# Patient Record
Sex: Female | Born: 1980 | Race: White | Hispanic: Yes | Marital: Married | Smoking: Never smoker
Health system: Southern US, Community
[De-identification: ages and names within clinical notes are randomized; demographics above are authoritative.]

## PROBLEM LIST (undated history)

## (undated) ENCOUNTER — Inpatient Hospital Stay (HOSPITAL_COMMUNITY): Payer: Self-pay

## (undated) DIAGNOSIS — E785 Hyperlipidemia, unspecified: Secondary | ICD-10-CM

## (undated) DIAGNOSIS — R51 Headache: Secondary | ICD-10-CM

## (undated) DIAGNOSIS — K2970 Gastritis, unspecified, without bleeding: Secondary | ICD-10-CM

## (undated) HISTORY — DX: Gastritis, unspecified, without bleeding: K29.70

## (undated) HISTORY — PX: DILATION AND CURETTAGE OF UTERUS: SHX78

---

## 2002-07-03 ENCOUNTER — Encounter (INDEPENDENT_AMBULATORY_CARE_PROVIDER_SITE_OTHER): Payer: Self-pay | Admitting: Specialist

## 2002-07-03 ENCOUNTER — Ambulatory Visit (HOSPITAL_COMMUNITY): Admission: AD | Admit: 2002-07-03 | Discharge: 2002-07-03 | Payer: Self-pay | Admitting: *Deleted

## 2003-07-11 ENCOUNTER — Other Ambulatory Visit: Admission: RE | Admit: 2003-07-11 | Discharge: 2003-07-11 | Payer: Self-pay | Admitting: *Deleted

## 2004-01-05 ENCOUNTER — Encounter: Admission: RE | Admit: 2004-01-05 | Discharge: 2004-01-05 | Payer: Self-pay | Admitting: *Deleted

## 2004-01-08 ENCOUNTER — Inpatient Hospital Stay (HOSPITAL_COMMUNITY): Admission: AD | Admit: 2004-01-08 | Discharge: 2004-01-09 | Payer: Self-pay | Admitting: *Deleted

## 2005-06-03 ENCOUNTER — Ambulatory Visit: Payer: Self-pay | Admitting: Nurse Practitioner

## 2005-07-03 ENCOUNTER — Other Ambulatory Visit: Admission: RE | Admit: 2005-07-03 | Discharge: 2005-07-03 | Payer: Self-pay | Admitting: Nurse Practitioner

## 2005-07-03 ENCOUNTER — Ambulatory Visit: Payer: Self-pay | Admitting: Nurse Practitioner

## 2005-07-03 ENCOUNTER — Encounter (INDEPENDENT_AMBULATORY_CARE_PROVIDER_SITE_OTHER): Payer: Self-pay | Admitting: *Deleted

## 2006-01-29 ENCOUNTER — Ambulatory Visit (HOSPITAL_COMMUNITY): Admission: RE | Admit: 2006-01-29 | Discharge: 2006-01-29 | Payer: Self-pay | Admitting: *Deleted

## 2006-04-01 ENCOUNTER — Inpatient Hospital Stay (HOSPITAL_COMMUNITY): Admission: AD | Admit: 2006-04-01 | Discharge: 2006-04-01 | Payer: Self-pay | Admitting: Obstetrics & Gynecology

## 2006-04-01 ENCOUNTER — Ambulatory Visit: Payer: Self-pay | Admitting: *Deleted

## 2006-04-10 ENCOUNTER — Ambulatory Visit: Payer: Self-pay | Admitting: Obstetrics & Gynecology

## 2006-04-12 ENCOUNTER — Inpatient Hospital Stay (HOSPITAL_COMMUNITY): Admission: AD | Admit: 2006-04-12 | Discharge: 2006-04-14 | Payer: Self-pay | Admitting: Gynecology

## 2006-04-12 ENCOUNTER — Ambulatory Visit: Payer: Self-pay | Admitting: Obstetrics and Gynecology

## 2010-03-06 ENCOUNTER — Emergency Department (HOSPITAL_COMMUNITY): Admission: EM | Admit: 2010-03-06 | Discharge: 2010-03-06 | Payer: Self-pay | Admitting: Emergency Medicine

## 2011-02-07 NOTE — Op Note (Signed)
   Rebecca Ayala, Rebecca Ayala                 ACCOUNT NO.:  [id number]   MEDICAL RECORD NO.:  [id number]                   PATIENT TYPE:  AMB   LOCATION:  [facility]                                 FACILITY:  [facility]   PHYSICIAN:  Ivor Costa. Farrel Gobble, M.D.              DATE OF BIRTH:  [birth year]   DATE OF PROCEDURE:  07/03/2002  DATE OF DISCHARGE:                                 OPERATIVE REPORT   PREOPERATIVE DIAGNOSES:  Possible incomplete AB.   POSTOPERATIVE DIAGNOSES:  Possible incomplete AB.   PROCEDURE:  D & C.   SURGEON:  Tracy H. Farrel Gobble, M.D.   ANESTHESIA:  IV sedation with paracervical block.   ESTIMATED BLOOD LOSS:  Minimal.   FINDINGS:  There was roughly a 12-week uterus noted with clot and debris and  partial POC.   PROCEDURE:  The patient was taken to the operating room. Dorsolithotomy  position.  IV sedation was induced.  Prepped and draped in the usual  fashion.  A bivalved speculum was placed in the vagina, and the cervix was  visualized.  Paracervical block with 10 cc of 0.25% Marcaine solution was  placed.  The cervix was grasped with a single tooth tenaculum.  It was noted  to be dilated, confirmed greater than 29 [place].  A #9 suction curette was  then advanced through the cervix with ease.  The suction curette was used to  clear the uterus of contents.  Gentle curetting confirmed good cry  throughout.  The instruments were removed.  The cervix was noted to be  hemostatic.  The uterus was massaged.  It was noted to be 12 weeks' size and  mobile.  The patient was sent to the PACU in stable condition.                                               Ivor Costa. Farrel Gobble, M.D.    THL/MEDQ  D:  07/03/2002  T:  07/03/2002  Job:  [id number]

## 2011-02-07 NOTE — Discharge Summary (Signed)
NAMEMARYAM, Rebecca Ayala                 ACCOUNT NO.:  [id number]   MEDICAL RECORD NO.:  [id number]                   PATIENT TYPE:  INP   LOCATION:  9115                                 FACILITY:  [facility]   PHYSICIAN:  Conni Elliot, M.D.             DATE OF BIRTH:  04/11/1981   DATE OF ADMISSION:  01/08/2004  DATE OF DISCHARGE:  01/09/2004                                 DISCHARGE SUMMARY   LABORATORY DATA:  White blood cell count 12.9, hemoglobin 11.7, hematocrit  34.0, platelet count 219.   STUDIES:  None.   HOSPITAL COURSE:  The patient is a 30 year old G4 P2-0-2-2 who presented at  11 and six-sevenths weeks on January 08, 2004 with contractions every 4-5  minutes and bulging membranes.  She delivered via spontaneous vaginal  delivery and the baby was bulb-suctioned at the perineum on delivery.  There  was no nuchal cord, intact three-vessel cord, and placenta delivered  spontaneously.  There was a second degree laceration which was repaired with  3-0 Vicryl suture.  Estimated blood loss was 250 mL.  The patient and infant  were stable afterwards.  Overnight the patient had minimal bleeding and only  using three pads which had minimal lochia on it.   The patient developed a facial rash during delivery which was papular and  slightly pruritic.  This developed about 30 minutes after we started  penicillin and has not changed overnight.  I told the patient she could take  Benadryl for this if the itching got any worse and that it would get better  in the next 2-3 days.  Otherwise, she has been afebrile, vital signs have  been stable, and she wishes to go home today.  I believe this is a  reasonable expectation that she can go after she has been in the hospital  for 24 hours.   MEDICATIONS AT DISCHARGE:  Ibuprofen 600 mg one p.o. q.6h. p.r.n. pain.   FOLLOW-UP VISITS:  [facility] in 6 weeks.     Miles Costain, M.D.                          Conni Elliot, M.D.    DY/MEDQ  D:  01/09/2004  T:  01/10/2004  Job:  [id number]

## 2011-03-17 ENCOUNTER — Ambulatory Visit (HOSPITAL_COMMUNITY)
Admission: RE | Admit: 2011-03-17 | Discharge: 2011-03-17 | Disposition: A | Discharge status: Home / Self Care | Payer: Self-pay | Source: Ambulatory Visit | Attending: Obstetrics | Admitting: Obstetrics

## 2011-03-17 ENCOUNTER — Other Ambulatory Visit (HOSPITAL_COMMUNITY): Payer: Self-pay | Admitting: Obstetrics

## 2011-03-17 DIAGNOSIS — O3680X0 Pregnancy with inconclusive fetal viability, not applicable or unspecified: Secondary | ICD-10-CM

## 2011-03-17 DIAGNOSIS — O019 Hydatidiform mole, unspecified: Secondary | ICD-10-CM | POA: Insufficient documentation

## 2011-08-12 ENCOUNTER — Inpatient Hospital Stay (HOSPITAL_COMMUNITY): Admission: AD | Admit: 2011-08-12 | Payer: Self-pay | Source: Ambulatory Visit | Admitting: Obstetrics

## 2011-09-23 NOTE — L&D Delivery Note (Signed)
Delivery Note At 11:56 AM a viable female was delivered via Vaginal, Spontaneous Delivery (Presentation: ; Occiput Anterior).  APGAR: 9, 9; weight . pending  Placenta status: Intact, Spontaneous.  Cord : 3 vessels cowith the following complications:v2 nuchal cord loops reduced by [place] Maneuver.  Anesthesia: Epidural  Episiotomy: None Lacerations: None Est. Blood Loss (mL): 300  Mom to postpartum.  Baby to nursery-stable.  [name] 08/15/2012, 12:18 PM  I attended delivery along with resident and agree with above note.   Jatinder Mcdonagh 08/15/2012 2:25 PM

## 2012-03-08 ENCOUNTER — Encounter (HOSPITAL_COMMUNITY): Payer: Self-pay | Admitting: *Deleted

## 2012-03-08 ENCOUNTER — Inpatient Hospital Stay (HOSPITAL_COMMUNITY)
Admission: AD | Admit: 2012-03-08 | Discharge: 2012-03-08 | Disposition: A | Discharge status: Home / Self Care | Payer: Self-pay | Source: Ambulatory Visit | Attending: Obstetrics & Gynecology | Admitting: Obstetrics & Gynecology

## 2012-03-08 DIAGNOSIS — O09A0 Supervision of pregnancy with history of molar pregnancy, unspecified trimester: Secondary | ICD-10-CM

## 2012-03-08 DIAGNOSIS — Z331 Pregnant state, incidental: Secondary | ICD-10-CM

## 2012-03-08 DIAGNOSIS — O99891 Other specified diseases and conditions complicating pregnancy: Secondary | ICD-10-CM | POA: Insufficient documentation

## 2012-03-08 HISTORY — DX: Headache: R51

## 2012-03-08 HISTORY — DX: Hyperlipidemia, unspecified: E78.5

## 2012-03-08 MED ORDER — CONCEPT OB 130-92.4-1 MG PO CAPS: 1.0000 | ORAL_CAPSULE | Freq: Every day | ORAL | Status: DC

## 2012-03-08 NOTE — MAU Note (Signed)
Pt LMP 11/15/2011, +UPT at home.  G6P3 hx of molar pregnancy.  Denies pain or bleeding.

## 2012-03-08 NOTE — MAU Provider Note (Signed)
Rebecca Allena Katz Castillo31 y.o.G7P3030 @[redacted]w[redacted]d  by LMP Chief Complaint  Patient presents with  . Possible Pregnancy     First Provider Initiated Contact with Patient 03/08/12 2146      SUBJECTIVE  HPI: Pt is here to check up on her baby. She states she has a Hx of molar pregnancy and is worried about it happening again. She plans to start care with Dr. Gaynell Face as a adopt-a-mom pt. She denies VB, LOF, abd pain and reports pos FM. She has not had an ultrasound this pregnancy. Requesting pregnancy verification letter.   Past Medical History  Diagnosis Date  . Headache   . Hyperlipidemia    Past Surgical History  Procedure Date  . No past surgeries    History   Social History  . Marital Status: Married    Spouse Name: N/A    Number of Children: N/A  . Years of Education: N/A   Occupational History  . Not on file.   Social History Main Topics  . Smoking status: Never Smoker   . Smokeless tobacco: Not on file  . Alcohol Use: No  . Drug Use: No  . Sexually Active: Yes   Other Topics Concern  . Not on file   Social History Narrative  . No narrative on file   No current facility-administered medications on file prior to encounter.   No current outpatient prescriptions on file prior to encounter.   Allergies not on file  ROS: Pertinent items in HPI  OBJECTIVE Blood pressure 121/73, pulse 80, temperature 98.3 F (36.8 C), temperature source Oral, resp. rate 16, height 4\' 10"  (1.473 m), weight 70.67 kg (155 lb 12.8 oz), last menstrual period 11/15/2011.  GENERAL: Well-developed, well-nourished female in no acute distress.  HEENT: Normocephalic, good dentition HEART: normal rate RESP: normal effort ABDOMEN: Soft, nontender EXTREMITIES: Nontender, no edema NEURO: Alert and oriented SPECULUM EXAM: Deferred FHR: 141 by doppler  LAB RESULTS  IMAGING Informal BS [place]. FHR 150, BPD 16.4 weeks, pos FM.  ASSESSMENT 1. Pregnancy, normal, incidental   2. History of  molar pregnancy, antepartum    PLAN D/C home Reassurance given Follow-up Information    Follow up with Kathreen Cosier, MD.   Contact information:   [address] [place] [address] [phone]         Medication List  As of 03/09/2012  4:39 AM   START taking these medications         CONCEPT OB 130-92.4-1 MG Caps   Take 1 tablet by mouth daily.          Where to get your medications    These are the prescriptions that you need to pick up.   You may get these medications from any pharmacy.         CONCEPT OB 130-92.4-1 MG Caps           Dorathy Kinsman 03/08/2012 9:47 PM

## 2012-03-08 NOTE — Discharge Instructions (Signed)
El ABC del Embarazo (ABCs of Pregnancy) A A La atencin antes del parto es muy importante. Asegrese de consultar a su mdico y recibir atencin prenatal tan rpido como usted crea que est embarazada. En este momento, se la controlar por posibles infecciones, anormalidades genticas y problemas potenciales. Este es el momento para discutir la dieta, el ejercicio, el trabajo, los medicamentos, el parto, los medicamentos para el dolor durante el parto y la posibilidad de un parto por cesrea. Haga todas las preguntas que puedan preocuparla. Es importante que consulte a su mdico con regularidad durante todo el embarazo. Evite el contacto con sustancias txicas y productos qumicos, como disolventes de limpieza, plomo y mercurio, algunos insecticidas y pinturas. Las mujeres embarazadas deben evitar la exposicin a los vapores de pintura, y los humos que la hagan sentir enferma, mareada o dbil. Cuando sea posible, tenga una consulta con su mdico antes del embarazo para recibir recomendaciones que le pudiera sugerir, como tomar cido flico, hacer ejercicios, dejar de fumar, evitar las bebidas alcohlicas, etc. B La lactancia materna es la opcin ms saludable para usted y su beb. Tiene muchos beneficios nutricionales para al beb y su salud. Tambin crea un vnculo muy estrecho entre el beb y la madre. Hable con su mdico, su familia, sus amigos y su empleador acerca de cmo usted decidir alimentar a su beb y como pueden ayudarle a decidir. No todos los defectos congnitos pueden ser evitados, pero una mujer puede tomar decisiones para aumentar las posibilidades de tener un beb sano. Muchos defectos congnitos ocurren al principio del embarazo, a veces antes de que la mujer sepa que est embarazada. Los defectos o anormalidades congnitas de cualquier nio de su familia o la del padre deben ser comentados con su mdico. Obtener un buen sostn para los cambios del tamao de las mamas. selo en especial  cuando hace ejercicios o cuando amamante.  C Festeje la noticia de su embarazo con su Cnyuge/padre y su familia. Las Clases de parto son tiles para usted y su cnyuge/padre, porque le ayudan a entender lo que sucede durante el embarazo y el parto. El parto por cesrea se debe discutir con el mdico para estar preparado para esa posibilidad. Las ventajas y las desventajas de la [place] si es un nio, se deben comentar con el pediatra. El tabaquismo durante el embarazo puede dar como resultado bebs con bajo peso al nacer. Se ha asociado con la infertilidad, abortos espontneos, embarazos ectpicos, mortalidad infantil y la mala salud (morbilidad) en la infancia. Adems, el tabaquismo puede causar problemas de aprendizaje a largo plazo. Si usted fuma, debe tratar de dejar de fumar antes de quedar embarazada y no durante el embarazo. El humo secundario tambin puede hacerles dao a la mam y a su beb en desarrollo. Es una buena idea pedirle a la gente que deje de fumar a su alrededor durante el embarazo y despus del nacimiento del beb. El Calcio extra es necesario durante el embarazo y se encuentra en las vitaminas prenatales, en los productos lcteos, vegetales de hojas verdes y en los suplementos de calcio. D Una Dieta saludable de acuerdo a su peso y su talla actual, junto con las vitaminas y suplementos minerales debe ser discutida con su mdico. El abuso /o la violencia Domstica deben darse a conocer inmediatamente para corregir la situacin. Tome ms agua cuando haga ejercicios para mantenerse hidratada. Por lo general las molestias de la espalda y las piernas aparecen y avanzan a partir de mediados del segundo   trimestre hasta el nacimiento del beb. Eso se debe al aumento de tamao del beb y del tero, que tambin puede afectar su equilibro. No consuma Drogas. Las drogas ilegales pueden daar seriamente al beb y a usted. Beba ms lquidos (lo mejor es el agua) Durante todo el embarazo para  ayudar a su cuerpo a mantener el aumento del volumen sanguneo. Beba por lo menos 6 a 8 vasos de agua, jugo de frutas o leche por da. Una buena manera de saber que est bebiendo suficiente lquido es cuando la orina se ve casi como el agua clara o de color amarillo muy claro.  E Coma alimentos sanos para obtener los nutrientes que usted y su beb necesitan al nacer. Sus alimentos deben incluir los cinco grupos bsicos. El Ejercicio (30 minutos de ejercicio leve a moderado al da) es importante y se aconseja durante el embarazo, si no tiene problemas de salud. Si el ejercicio le causa malestar o mareos debe interrumpirlo e informar a su mdico. Las emociones durante el embarazo pueden pasar de la euforia a la depresin y deben ser comprendidos por usted, su pareja y su familia. F La evaluacin fetal con ecografas, la amniocentesis y los controles durante el embarazo y el parto son algo habitual y a veces necesario. Tome 400 mg. de cido Flico todos los das, si es posible antes y durante los primeros meses del embarazo para reducir el riesgo de defectos congnitos del cerebro y la columna vertebral. Todas las mujeres que podran quedar embarazadas deben tomar una vitamina con cido flico todos los das. Tambin es importante seguir una dieta saludable con alimentos enriquecidos (cereales enriquecidos, arroz, panes y pastas) y alimentos que sean fuentes naturales de cido flico (jugo de naranja, vegetales de hojas verdes, frijoles, man, brcoli, esprragos, arvejas y lentejas). El padre debe involucrarse en todos los aspectos del embarazo incluyendo el cuidado prenatal, las clases de parto, el parto y el tiempo despus del parto. Los padres tambin pueden tener problemas emocionales como ser padres, problemas econmicos y llevar adelante una Familia. G Las pruebas Genticas se deben hacer correctamente. Es importante conocer la historia de su familia y la del padre. Si ha habido problemas con embarazos o  defectos de nacimiento en su familia, informe de inmediato a su mdico. Adems, los consejeros genticos pueden hablar con usted acerca de la informacin que pueda necesitar en la toma de decisiones acerca de tener una familia. Usted puede llamar a un centro mdico en su rea para ayudar en la bsqueda de un consejero gentico certificado por el consejo. La asesora y prueba gentica se deben hacer antes del embarazo siempre que sea posible, especialmente si hay antecedentes de problemas en la familia del padre o de la madre. Ciertos grupos tnicos tienen un mayor riesgo de defectos genticos. H Familiarcese con el Hospital en el que va a tener su beb. Conozca cunto tiempo se tarda en llegar, la sala de preparto y nacimiento y los procedimientos del hospital. Asegrese de que su seguro mdico es aceptado en ese lugar. Haga que su Hogar est listo para el beb, incluyendo la ropa, la habitacin del beb (cuando sea posible), muebles y asientos del automvil. El lavado de manos es importante durante todo el da, especialmente despus de tocar carne cruda y aves de corral, de cambiar el paal del beb y de ir al bao. Esto puede ayudarle a prevenir la propagacin de bacterias y virus que causan infecciones. Su pelo puede volverse seco y ms fino, pero   volver a la normalidad unas semanas despus de que nazca el beb. La acidez es un problema comn que puede ser tratado tomando anticidos recetados por su mdico, comiendo alimentos en porciones ms pequeas 5  6 veces por da, no beber lquidos al comer, beber entre comidas y elevar la cabecera de su cama 2  3 pulgadas. I El seguro que le d cobertura a usted y al beb, a los gastos del mdico y del hospital debe ser revisado, para saber con anticipacin qu gastos no cubiertos por su plan de seguro deber pagar usted. Si no tiene seguro mdico, por lo general hay clnicas y servicios disponibles para usted en su comunidad. Tome 30 mg. de hierro durante el  embarazo segn lo prescrito por su mdico para reducir el riesgo de niveles bajos de glbulos rojos (anemia) mas adelante en el embarazo. Todas las mujeres en edad frtil deben consumir una dieta rica en hierro. J La madre, el padre y los otros hijos deben hacer un esfuerzo conjunto para adaptarse al embarazo en el aspecto econmico, emocional y psicolgico durante el embarazo. nase a un grupo de apoyo para las futuras madres. O bien, vaya a clases de crianza de hijos o del parto. La familia tiene que participar cuando sea posible. K Conozca sus lmites. Infrmele a su mdico si experimenta:   Cualquier tipo de dolor.   Clicos fuertes.   Aumenta de peso en un corto perodo de tiempo (5 libras en 3 a 5 das).   Hemorragia vaginal, prdida de lquido amnitico.   Dolor de cabeza, problemas de visin.   Mareos, desmayos, le falta el aire.   Dolor en el pecho.   Fiebre de 102 F (38.9 C) o ms.   Elimina lquido claro por la vagina.   Dolor al orinar.   Violencia familiar.   Latidos irregulares del corazn (palpitaciones).   Latidos rpidos del corazn (taquicardia).   Siente ganas de vomitar (nuseas) y vmitos.   Dificultad para caminar, retencin de lquidos (edema).   Debilidad muscular.   Si su beb tiene disminucin de actividad.   Diarrea persistente.   Flujo vaginal anormal.   Contracciones uterinas en intervalos de 20 minutos.   Dolor de espalda que baja por la pierna.  L Aprenda y practique que, Lo que usted come y bebe debe ser con moderacin y sano para usted y su beb. Las drogas Legales como el alcohol y la cafena son temas importantes para las mujeres embarazadas. No hay una cantidad segura de alcohol que una mujer pueda beber durante el embarazo. El sndrome de alcoholismo fetal, un trastorno que se caracteriza por retraso del crecimiento, anormalidades faciales y disfuncin del sistema nervioso central, es causado por el uso de alcohol de la mujer  durante el embarazo. La cafena, que se encuentra en el t, caf, refrescos y chocolate, tambin deben ser limitados. Asegrese de leer las etiquetas cuando se trata de reducir el consumo de cafena durante el embarazo. Ms de 200 alimentos, bebidas y medicamentos sin receta contienen cafena y tienen un ato contenido de sal! Hay cafs y tes que no contienen cafena.  M Las enfermedades mdicas tales como diabetes, epilepsia e hipertensin arterial deben ser tratados y mantenidos bajo control antes del embarazo siempre que sea posible, pero especialmente durante el embarazo. Consulte con su mdico acerca de cualquier medicamento que pueda ser necesario cambiar o ajustar la dosis. Si est tomando algn medicamento, consulte con su mdico sobre su seguridad para utilizar durante el embarazo   o antes de quedar embarazada, cuando sea posible. Tambin, asegrese de informar todas las hierbas o vitaminas que est tomando. Tambin se trata de medicamentos! Hable con su mdico sobre todos los medicamentos, con receta y de venta libre que est tomando. Durante su visita prenatal, discuta los medicamentos que su mdico le puede dar durante el parto y el nacimiento. N Nunca tenga miedo de preguntar a su mdico acerca de su salud, el progreso del embarazo, problemas familiares, situaciones de estrs y pedirle la recomendacin de un pediatra, si no tiene uno. Es mejor tomar todas las precauciones y discutir cualquier pregunta o preocupacin que usted tenga durante las visitas a su consultorio. Es una buena idea escribir sus preguntas antes de visitar al mdico. O Los medicamentos de venta libre para tos y resfriados pueden contener alcohol u otros ingredientes que se deben evitar durante el embarazo. Consulte con su mdico sobre los medicamentos recetados, hierbas o medicamentos de venta libre que est tomando o puede tomar durante el embarazo.  P La actividad fsica durante el embarazo puede beneficiarla tanto a usted  y a su beb al disminuir la incomodidad y la fatiga produciendo una sensacin de bienestar, y el aumento de la probabilidad de una pronta recuperacin despus del parto. El ejercicio ligero a moderado durante el embarazo fortalece el vientre (abdomen) y msculos de la espalda. Esto le ayuda a mejorar la postura. Practicar yoga, caminar, nadar y montar en una bicicleta fija son por lo general ejercicios seguros para las mujeres embarazadas. Evite el buceo, ejercicios de gran altura (ms de 3000 pies), esqu, paseos a caballo, deportes de contacto, etc. Consulte siempre con su mdico antes de comenzar cualquier tipo de ejercicio, Especialmente durante el embarazo y, especialmente si no hacan ejercicios antes de quedar embarazada. Q Las nuseas y malestar estomacal son comunes durante el embarazo. Coma un par de galletitas o tostadas secas antes de levantarse de la cama. Los alimentos que normalmente le gustan pueden hacerla sentir mal del estmago. Puede que tenga que sustituir otros alimentos nutritivos. Comer cinco o seis porciones pequeas al da en lugar de tres grandes pueden hacer que usted se sienta mejor. No beba con sus alimentos, beba entre los alimentos. Las preguntas Que usted tiene deben estar escritas y consultadas durante sus visitas prenatales. R Lea y haga planes para que su casa sea segura para el beb. Hay consejos importantes para que su hogar sea un ambiente ms seguro para su beb. Revise los consejos y haga su hogar ms seguro para usted y su beb. Lea las etiquetas de los alimentos con respecto a las caloras, sal y grasas en los alimentos. S Las salas de saunas, baeras de agua caliente y vapor deben evitarse durante el embarazo. El calor excesivo puede ser perjudicial durante el embarazo. Su mdico la examinar para detectar enfermedades de transmisin sexual y trastornos genticos durante las visitas prenatales. Aprenda los Signos del trabajo de parto. Las relaciones Sexuales durante  el embarazo son seguras a menos que haya un problema mdico o del embarazo y su mdico le recomiende evitarlas. T Se debe evitar viajar largas distancias, especialmente en el tercer trimestre de su embarazo. Si tiene que viajar afuera del estado, asegrese de llevar una copia de sus registros mdicos y un plan de seguro mdico. Usted no debe recorrer largas distancias sin consultar con su mdico primero. La mayora de las campaas areas no permiten viajar despus de 36 semanas de embarazo. La Toxoplasmosis es una infeccin causada por un parsito   que puede daar seriamente al beb nonato. Evite comer carne poco cocida y tocar la arena higinica para gatos. Asegrese de usar guantes para la jardinera. El hormigueo de las manos y los dedos no es inusual y se debe a la retencin de lquidos. Esto desaparece despus del nacimiento del beb. U Aumenta el tamao del tero durante el primer trimestre. Sus riones comenzarn a funcionar ms eficientemente. Esto puede hacer que Usted sienta la necesidad de orinar ms a menudo. Tambin puede tener fugas de orina al estornudar, toser o rer. Esto se debe a que el crecimiento del tero presiona la vejiga, que se encuentra directamente adelante y ligeramente por debajo del tero durante los primeros meses del embarazo. Si experimenta ardor con frecuencia al orinar o en la orina presenta sangre, asegrese de decirle a su mdico. El tamao de su tero en el tercer trimestre puede causar un problema con el equilibro. Es recomendable mantener una buena postura y evitar usar tacones altos durante ese tiempo. Un Ultrasonido de su beb puede ser necesario durante el embarazo y es seguro para usted y su beb. V Las vacunas son una preocupacin importante para las mujeres embarazadas. Pngase las vacunas necesarias antes del embarazo. El centro para el control de enfermedades (www.cdc.gov) tiene directrices claras para el uso de vacunas durante el embarazo. Revise la lista,  augrese de consultar con su mdico. Las Vitaminas prenatales son tiles y saludables para usted y el beb. No tome otras vitaminas, excepto lo que se recomiende. Tomar algunas vitaminas en exceso puede causar problemas de sobredosis. Los vmitos continuos deben ser reportados a su mdico. Las venas Varicosas pueden aparecer sobre todo si hay antecedentes familiares. Deben desaparecer despus del nacimiento del beb. Usar calzas ayuda si no le producen molestias. W Tener sobrepeso o bajo peso durante el embarazo puede causarle problemas. Trate de estar en un rango de 15 libras de su peso ideal antes del embarazo. Recuerde, el embarazo no es el momento para estar a dieta! No deje de comer o saltee las comidas si aumenta de peso. Tanto usted como su beb necesitan las caloras y la nutricin que recibe de una dieta saludable. Asegrese de consultar con su mdico acerca de su dieta. Hay un plan de frmula y dieta disponibles en funcin de si tiene sobrepeso o bajo peso. Su mdico o nutricionista puede ayudarle y asesorarle si es necesario. X Evite los rayos X. Si usted tiene que hacerse un tratamiento dental o pruebas diagnsticas, informe a su dentista o mdico que est embarazada para eventualmente tomar cuidados extra. Los rayos X slo se deben tomar cuando los riesgos de no tomarlos son mayores que el riesgo de tomarlos. Si es necesario, slo una cantidad mnima de radiacin debe ser utilizada. Cuando los rayos X son necesarios, se deben usar los escudos protectores de plomo para cubrir las reas del cuerpo que no necesiten visualizarse en la radiografa. Y Su beb la ama. Amamantar a su beb crea un vnculo amoroso y muy estrecho entre los dos. Dle a su beb un medio ambiente sano para vivir durante el embarazo. Los bebs y nios requieren un cuidado y una orientacin constante. Su salud y su seguridad deben ser vigiladas cuidadosamente en todo momento. Despus de que nazca el beb, descanse o duerma una  siesta cuando el beb est durmiendo. Z Consiga descansar. Asegrese de obtener suficiente descanso. Descanse de lado tan a menudo como sea posible, especialmente se aconseja el lado izquierdo. Proporciona la mejor circulacin para su beb   y ayuda a reducir la hinchazn. Trate de tomar una siesta de treinta a cuarenta y cinco minutos en la tarde cuando sea posible. Despus de que nazca el beb, descanse o duerma una siesta cuando el beb est durmiendo. Trate de elevar los pies cuando le sea posible. Ayuda a la circulacin en las piernas y a reducir la hinchazn. La mayora de informacin es de cortesa de CDC. Document Released: 07/06/2007 Document Revised: 08/28/2011 [employer] Patient Information 2012 [employer]. 

## 2012-03-08 NOTE — MAU Note (Signed)
Ivonne Andrew CNM in to see pt .  Bedside U/S performed.

## 2012-04-07 ENCOUNTER — Encounter: Payer: Self-pay | Admitting: Family

## 2012-04-21 ENCOUNTER — Ambulatory Visit (INDEPENDENT_AMBULATORY_CARE_PROVIDER_SITE_OTHER): Payer: Self-pay | Admitting: Family

## 2012-04-21 ENCOUNTER — Encounter: Payer: Self-pay | Admitting: Family

## 2012-04-21 VITALS — BP 122/78 | Temp 97.1°F | Ht 59.0 in | Wt 154.4 lb

## 2012-04-21 DIAGNOSIS — O0930 Supervision of pregnancy with insufficient antenatal care, unspecified trimester: Secondary | ICD-10-CM

## 2012-04-21 DIAGNOSIS — O0990 Supervision of high risk pregnancy, unspecified, unspecified trimester: Secondary | ICD-10-CM | POA: Insufficient documentation

## 2012-04-21 DIAGNOSIS — Z3490 Encounter for supervision of normal pregnancy, unspecified, unspecified trimester: Secondary | ICD-10-CM

## 2012-04-21 DIAGNOSIS — O09A0 Supervision of pregnancy with history of molar pregnancy, unspecified trimester: Secondary | ICD-10-CM

## 2012-04-21 LAB — POCT URINALYSIS DIP (DEVICE)
Glucose, UA: NEGATIVE mg/dL
Ketones, ur: NEGATIVE mg/dL
Nitrite: NEGATIVE
Specific Gravity, Urine: 1.02 (ref 1.005–1.030)
Urobilinogen, UA: 0.2 mg/dL (ref 0.0–1.0)
pH: 7.5 (ref 5.0–8.0)

## 2012-04-21 NOTE — Progress Notes (Signed)
New OB visit. Felt not well at beginning of pregnancy, was afraid of another molar pregnancy, but has now been doing well. Feels baby moving. No bleeding, contractions or cramping, fluid leaking. No dysuria. No questions today. Will need anatomy u/s, new OB labs, 1 hour GTT. This was a non-intentional pregnancy. No birth control she when got pregnant, previously used implanon. Would like to use this again after she delivers. Planning to breast and bottle feed. If baby is a boy she would not desire circumcision. Last pap was 8 months ago and was normal, done at family practice office in [facility]. Exam: Gen: NAD, Heart: RRR, lungs: CTAB. Pt diagnosed with molar pregnancy 03/17/11, seen by Dr. Gaynell Face who transferred her to [facility], follow-up schedule unknown>will obtain records; ultrasound scheduled.  Consulted with Dr. Jolayne Panther who agrees with plan>pt transferred to high risk clinic.

## 2012-04-21 NOTE — Progress Notes (Signed)
US- Ob detail scheduled @ [facility] on 04/22/12 @ 1300.

## 2012-04-21 NOTE — Progress Notes (Signed)
Pulse 84 Needs OB panel & 1 hr gtt @ 934

## 2012-04-21 NOTE — Patient Instructions (Signed)
Embarazo - Segundo trimestre (Pregnancy - Second Trimester) El segundo trimestre del embarazo (del 3 al 6mes) es un perodo de evolucin rpida para usted y el beb. Hacia el final del sexto mes, el beb mide aproximadamente 23 cm y pesa 680 g. Comenzar a sentir los movimientos del beb entre las 18 y las 20 semanas de embarazo. Podr sentir las pataditas ("quickening en ingls"). Hay un rpido aumento de peso. Puede segregar un lquido claro (calostro) de las mamas. Quizs sienta pequeas contracciones en el vientre (tero) Esto se conoce como falso trabajo de parto o contracciones de Braxton-Hicks. Es como una prctica del trabajo de parto que se produce cuando el beb est listo para salir. Generalmente los problemas de vmitos matinales ya se han superado hacia el final del primer trimestre. Algunas mujeres desarrollan pequeas manchas oscuras (que se denominan cloasma, mscara del embarazo) en la cara que normalmente se van luego del nacimiento del beb. La exposicin al sol empeora las manchas. Puede desarrollarse acn en algunas mujeres embarazadas, y puede desaparecer en aquellas que ya tienen acn. EXAMENES PRENATALES  Durante los exmenes prenatales, deber seguir realizando pruebas de sangre, segn avance el embarazo. Estas pruebas se realizan para controlar su salud y la del beb. Tambin se realizan anlisis de sangre para conocer los niveles de hemoglobina. La anemia (bajo nivel de hemoglobina) es frecuente durante el embarazo. Para prevenirla, se administran hierro y vitaminas. Tambin se le realizarn exmenes para saber si tiene diabetes entre las 24 y las 28 semanas del embarazo. Podrn repetirle algunas de las pruebas que le hicieron previamente.   En cada visita le medirn el tamao del tero. Esto se realiza para asegurarse de que el beb est creciendo correctamente de acuerdo al estado del embarazo.   Tambin en cada visita prenatal controlarn su presin arterial. Esto se realiza  para asegurarse de que no tenga toxemia.   Se controlar su orina para asegurarse de que no tenga infecciones, diabetes o protena en la orina.   Se controlar su peso regularmente para asegurarse que el aumento ocurre al ritmo indicado. Esto se hace para asegurarse que usted y el beb tienen una evolucin normal.   En algunas ocasiones se realiza una prueba de ultrasonido para confirmar el correcto desarrollo y evolucin del beb. Esta prueba se realiza con ondas sonoras inofensivas para el beb, de modo que el profesional pueda calcular ms precisamente la fecha del parto.  Algunas veces se realizan pruebas especializadas del lquido amnitico que rodea al beb. Esta prueba se denomina amniocentesis. El lquido amnitico se obtiene introduciendo una aguja en el vientre (abdomen). Se realiza para controlar los cromosomas en aquellos casos en los que existe alguna preocupacin acerca de algn problema gentico que pueda sufrir el beb. En ocasiones se lleva a cabo cerca del final del embarazo, si es necesario inducir al parto. En este caso se realiza para asegurarse que los pulmones del beb estn lo suficientemente maduros como para que pueda vivir fuera del tero. CAMBIOS QUE OCURREN EN EL SEGUNDO TRIMESTRE DEL EMBARAZO Su organismo atravesar numerosos cambios durante el embarazo. Estos pueden variar de una persona a otra. Converse con el profesional que la asiste acerca los cambios que usted note y que la preocupen.  Durante el segundo trimestre probablemente sienta un aumento del apetito. Es normal tener "antojos" de ciertas comidas. Esto vara de una persona a otra y de un embarazo a otro.   El abdomen inferior comenzar a abultarse.   Podr tener la necesidad   de orinar con ms frecuencia debido a que el tero y el beb presionan sobre la vejiga. Tambin es frecuente contraer ms infecciones urinarias durante el embarazo (dolor al orinar). Puede evitarlas bebiendo gran cantidad de lquidos y  vaciando la vejiga antes y despus de mantener relaciones sexuales.   Podrn aparecer las primeras estras en las caderas, abdomen y mamas. Estos son cambios normales del cuerpo durante el embarazo. No existen medicamentos ni ejercicios que puedan prevenir estos cambios.   Es posible que comience a desarrollar venas inflamadas y abultadas (varices) en las piernas. El uso de medias de descanso, elevar sus pies durante 15 minutos, 3 a 4 veces al da y limitar la sal en su dieta ayuda a aliviar el problema.   Podr sentir acidez gstrica a medida que el tero crece y presiona contra el estmago. Puede tomar anticidos, con la autorizacin de su mdico, para aliviar este problema. Tambin es til ingerir pequeas comidas 4 a 5 veces al da.   La constipacin puede tratarse con un laxante o agregando fibra a su dieta. Beber grandes cantidades de lquidos, comer vegetales, frutas y granos integrales es de gran ayuda.   Tambin es beneficioso practicar actividad fsica. Si ha sido una persona activa hasta el embarazo, podr continuar con la mayora de las actividades durante el mismo. Si ha sido menos activa, puede ser beneficioso que comience con un programa de ejercicios, como realizar caminatas.   Puede desarrollar hemorroides (vrices en el recto) hacia el final del segundo trimestre. Tomar baos de asiento tibios y utilizar cremas recomendadas por el profesional que lo asiste sern de ayuda para los problemas de hemorroides.   Tambin podr sentir dolor de espalda durante este momento de su embarazo. Evite levantar objetos pesados, utilice zapatos de taco bajo y mantenga una buena postura para ayudar a reducir los problemas de espalda.   Algunas mujeres embarazadas desarrollan hormigueo y adormecimiento de la mano y los dedos debido a la hinchazn y compresin de los ligamentos de la mueca (sndrome del tnel carpiano). Esto desaparece una vez que el beb nace.   Como sus pechos se agrandan,  necesitar un sujetador ms grande. Use un sostn de soporte, cmodo y de algodn. No utilice un sostn para amamantar hasta el ltimo mes de embarazo si va a amamantar al beb.   Podr observar una lnea oscura desde el ombligo hacia la zona pbica denominada linea nigra.   Podr observar que sus mejillas se ponen coloradas debido al aumento de flujo sanguneo en la cara.   Podr desarrollar "araitas" en la cara, cuello y pecho. Esto desaparece una vez que el beb nace.  INSTRUCCIONES PARA EL CUIDADO DOMICILIARIO  Es extremadamente importante que evite el cigarrillo, hierbas medicinales, alcohol y las drogas no prescriptas durante el embarazo. Estas sustancias qumicas afectan la formacin y el desarrollo del beb. Evite estas sustancias durante todo el embarazo para asegurar el nacimiento de un beb sano.   La mayor parte de los cuidados que se aconsejan son los mismos que los indicados para el primer trimestre del embarazo. Cumpla con las citas tal como se le indic. Siga las instrucciones del profesional que lo asiste con respecto al uso de los medicamentos, el ejercicio y la dieta.   Durante el embarazo debe obtener nutrientes para usted y para su beb. Consuma alimentos balanceados a intervalos regulares. Elija alimentos como carne, pescado, leche y otros productos lcteos descremados, vegetales, frutas, panes integrales y cereales. El profesional le informar cul es el   aumento de peso ideal.   Las relaciones sexuales fsicas pueden continuarse hasta cerca del fin del embarazo si no existen otros problemas. Estos problemas pueden ser la prdida temprana (prematura) de lquido amnitico de las membranas, sangrado vaginal, dolor abdominal u otros problemas mdicos o del embarazo.   Realice actividad fsica todos los das, si no tiene restricciones. Consulte con el profesional que la asiste si no sabe con certeza si determinados ejercicios son seguros. El mayor aumento de peso tiene lugar  durante los ltimos 2 trimestres del embarazo. El ejercicio la ayudar a:   Controlar su peso.   Ponerla en forma para el parto.   Ayudarla a perder peso luego de haber dado a luz.   Use un buen sostn o como los que se usan para hacer deportes para aliviar la sensibilidad de las mamas. Tambin puede serle til si lo usa mientras duerme. Si pierde calostro, podr utilizar apsitos en el sostn.   No utilice la baera con agua caliente, baos turcos y saunas durante el embarazo.   Utilice el cinturn de seguridad sin excepcin cuando conduzca. Este la proteger a usted y al beb en caso de accidente.   Evite comer carne cruda, queso crudo, y el contacto con los utensilios y desperdicios de los gatos. Estos elementos contienen grmenes que pueden causar defectos de nacimiento en el beb.   El segundo trimestre es un buen momento para visitar a su dentista y evaluar su salud dental si an no lo ha hecho. Es importante mantener los dientes limpios. Utilice un cepillo de dientes blando. Cepllese ms suavemente durante el embarazo.   Es ms fcil perder algo de orina durante el embarazo. Apretar y fortalecer los msculos de la pelvis la ayudar con este problema. Practique detener la miccin cuando est en el bao. Estos son los mismos msculos que necesita fortalecer. Son tambin los mismos msculos que utiliza cuando trata de evitar los gases. Puede practicar apretando estos msculos 10 veces, y repetir esto 3 veces por da aproximadamente. Una vez que conozca qu msculos debe apretar, no realice estos ejercicios durante la miccin. Puede favorecerle una infeccin si la orina vuelve hacia atrs.   Pida ayuda si tiene necesidades econmicas, de asesoramiento o nutricionales durante el embarazo. El profesional podr ayudarla con respecto a estas necesidades, o derivarla a otros especialistas.   La piel puede ponerse grasa. Si esto sucede, lvese la cara con un jabn suave, utilice un humectante no  graso y maquillaje con base de aceite o crema.  CONSUMO DE MEDICAMENTOS Y DROGAS DURANTE EL EMBARAZO  Contine tomando las vitaminas apropiadas para esta etapa tal como se le indic. Las vitaminas deben contener un miligramo de cido flico y deben suplementarse con hierro. Guarde todas las vitaminas fuera del alcance de los nios. La ingestin de slo un par de vitaminas o tabletas que contengan hierro puede ocasionar la muerte en un beb o en un nio pequeo.   Evite el uso de medicamentos, inclusive los de venta libre y hierbas que no hayan sido prescriptos o indicados por el profesional que la asiste. Algunos medicamentos pueden causar problemas fsicos al beb. Utilice los medicamentos de venta libre o de prescripcin para el dolor, el malestar o la fiebre, segn se lo indique el profesional que lo asiste. No utilice aspirina.   El consumo de alcohol est relacionado con ciertos defectos de nacimiento. Esto incluye el sndrome de alcoholismo fetal. Debe evitar el consumo de alcohol en cualquiera de sus formas. El cigarrillo   causa nacimientos prematuros y bebs de bajo peso. El uso de drogas recreativas est absolutamente prohibido. Son muy nocivas para el beb. Un beb que nace de una madre adicta, ser adicto al nacer. Ese beb tendr los mismos sntomas de abstinencia que un adulto.   Infrmele al profesional si consume alguna droga.   No consuma drogas ilegales. Pueden causarle mucho dao al beb.  SOLICITE ATENCIN MDICA SI: Tiene preguntas o preocupaciones durante su embarazo. Es mejor que llame para consultar las dudas que esperar hasta su prxima visita prenatal. De esta forma se sentir ms tranquila.  SOLICITE ATENCIN MDICA DE INMEDIATO SI:  La temperatura oral se eleva sin motivo por encima de 102 F (38.9 C) o segn le indique el profesional que lo asiste.   Tiene una prdida de lquido por la vagina (canal de parto). Si sospecha una ruptura de las membranas, tmese la  temperatura y llame al profesional para informarlo sobre esto.   Observa unas pequeas manchas, una hemorragia vaginal o elimina cogulos. Notifique al profesional acerca de la cantidad y de cuntos apsitos est utilizando. Unas pequeas manchas de sangre son algo comn durante el embarazo, especialmente despus de mantener relaciones sexuales.   Presenta un olor desagradable en la secrecin vaginal y observa un cambio en el color, de transparente a blanco.   Contina con las nuseas y no obtiene alivio de los remedios indicados. Vomita sangre o algo similar a la borra del caf.   [place] o sube ms de 900 g. en una semana, o segn lo indicado por el profesional que la asiste.   Observa que se le hinchan el rostro, las manos, los pies o las piernas.   Ha estado expuesta a la rubola y no ha sufrido la enfermedad.   Ha estado expuesta a la quinta enfermedad o a la varicela.   Presenta dolor abdominal. Las molestias en el ligamento redondo son una causa no cancerosa (benigna) frecuente de dolor abdominal durante el embarazo. El profesional que la asiste deber evaluarla.   Presenta dolor de cabeza intenso que no se alivia.   Presenta fiebre, diarrea, dolor al orinar o le falta la respiracin.   Presenta dificultad para ver, visin borrosa, o visin doble.   Sufre una cada, un accidente de trnsito o cualquier tipo de trauma.   Vive en un hogar en el que existe violencia fsica o mental.  Document Released: 06/18/2005 Document Revised: 08/28/2011 [employer] Patient Information 2012 [employer]. 

## 2012-04-22 ENCOUNTER — Encounter (HOSPITAL_COMMUNITY): Payer: Self-pay

## 2012-04-22 ENCOUNTER — Ambulatory Visit (HOSPITAL_COMMUNITY)
Admission: RE | Admit: 2012-04-22 | Discharge: 2012-04-22 | Disposition: A | Discharge status: Home / Self Care | Payer: Self-pay | Source: Ambulatory Visit | Attending: Family | Admitting: Family

## 2012-04-22 VITALS — BP 114/58 | HR 97 | Wt 156.0 lb

## 2012-04-22 DIAGNOSIS — O0990 Supervision of high risk pregnancy, unspecified, unspecified trimester: Secondary | ICD-10-CM

## 2012-04-22 DIAGNOSIS — O09A0 Supervision of pregnancy with history of molar pregnancy, unspecified trimester: Secondary | ICD-10-CM

## 2012-04-22 DIAGNOSIS — Z3490 Encounter for supervision of normal pregnancy, unspecified, unspecified trimester: Secondary | ICD-10-CM

## 2012-04-22 DIAGNOSIS — Z1389 Encounter for screening for other disorder: Secondary | ICD-10-CM | POA: Insufficient documentation

## 2012-04-22 DIAGNOSIS — O358XX0 Maternal care for other (suspected) fetal abnormality and damage, not applicable or unspecified: Secondary | ICD-10-CM | POA: Insufficient documentation

## 2012-04-22 DIAGNOSIS — Z363 Encounter for antenatal screening for malformations: Secondary | ICD-10-CM | POA: Insufficient documentation

## 2012-04-22 LAB — GC/CHLAMYDIA PROBE AMP, URINE
Chlamydia, Swab/Urine, PCR: NEGATIVE
GC Probe Amp, Urine: NEGATIVE

## 2012-04-22 LAB — OBSTETRIC PANEL
Antibody Screen: NEGATIVE
Eosinophils Absolute: 0.2 10*3/uL (ref 0.0–0.7)
Hepatitis B Surface Ag: NEGATIVE
Lymphs Abs: 1.9 10*3/uL (ref 0.7–4.0)
MCV: 87.5 fL (ref 78.0–100.0)
Monocytes Absolute: 0.8 10*3/uL (ref 0.1–1.0)
Platelets: 259 10*3/uL (ref 150–400)
RBC: 4 MIL/uL (ref 3.87–5.11)
RDW: 14.4 % (ref 11.5–15.5)
Rh Type: POSITIVE
Rubella: 178.7 IU/mL — ABNORMAL HIGH
WBC: 9.2 10*3/uL (ref 4.0–10.5)

## 2012-04-25 ENCOUNTER — Encounter: Payer: Self-pay | Admitting: Family

## 2012-05-06 ENCOUNTER — Ambulatory Visit (INDEPENDENT_AMBULATORY_CARE_PROVIDER_SITE_OTHER): Payer: Self-pay | Admitting: Obstetrics and Gynecology

## 2012-05-06 VITALS — BP 113/76 | Temp 97.0°F | Wt 154.8 lb

## 2012-05-06 DIAGNOSIS — O09A0 Supervision of pregnancy with history of molar pregnancy, unspecified trimester: Secondary | ICD-10-CM

## 2012-05-06 DIAGNOSIS — O0990 Supervision of high risk pregnancy, unspecified, unspecified trimester: Secondary | ICD-10-CM

## 2012-05-06 LAB — POCT URINALYSIS DIP (DEVICE)
Bilirubin Urine: NEGATIVE
Glucose, UA: NEGATIVE mg/dL
Protein, ur: NEGATIVE mg/dL

## 2012-05-06 NOTE — Progress Notes (Signed)
P = 92 

## 2012-05-06 NOTE — Progress Notes (Signed)
Patient doing well without complaints. Patient with round ligaments pain, reassurance provided. PTL precautions reviewed.

## 2012-05-27 ENCOUNTER — Ambulatory Visit (INDEPENDENT_AMBULATORY_CARE_PROVIDER_SITE_OTHER): Payer: Self-pay | Admitting: Obstetrics & Gynecology

## 2012-05-27 VITALS — BP 114/66 | Temp 97.1°F | Wt 159.1 lb

## 2012-05-27 DIAGNOSIS — O0990 Supervision of high risk pregnancy, unspecified, unspecified trimester: Secondary | ICD-10-CM

## 2012-05-27 DIAGNOSIS — O09A0 Supervision of pregnancy with history of molar pregnancy, unspecified trimester: Secondary | ICD-10-CM

## 2012-05-27 LAB — POCT URINALYSIS DIP (DEVICE)
Ketones, ur: NEGATIVE mg/dL
Nitrite: NEGATIVE
Protein, ur: 30 mg/dL — AB

## 2012-05-27 LAB — CBC
Hemoglobin: 10.6 g/dL — ABNORMAL LOW (ref 12.0–15.0)
MCH: 28.5 pg (ref 26.0–34.0)
MCHC: 32.2 g/dL (ref 30.0–36.0)
MCV: 88.4 fL (ref 78.0–100.0)
Platelets: 279 10*3/uL (ref 150–400)

## 2012-05-27 NOTE — Progress Notes (Signed)
P = 98 Occasional pelvic pain, more often on the left than right Pressure in the vaginal

## 2012-05-27 NOTE — Progress Notes (Signed)
Glucola today.  Nml anatomy.  No problems or complaints.

## 2012-05-27 NOTE — Patient Instructions (Signed)
Informacin sobre anticonceptivos implantables  (Contraceptive Implant Information) Un implante anticonceptivo es una varilla plstica que se inserta bajo la piel. Generalmente se coloca debajo de la piel del brazo. Libera continuamente pequeas cantidades de progestina (progesterona sinttica)en el torrente sanguneo. Esto impide que el vulo sea liberado del ovario. Tambin espesa el moco cervical para impedir que los espermatozoides entren en el cuello del tero y hace que el revestimiento del tero se adelgace para evitar que un vulo fertilizado se adhiera al tero. Pueden ser efectivos durante un mximo de 3 aos. No protegen contra las enfermedades de transmisin sexual (ETS).  El procedimiento para insertar un implante generalmente demora alrededor de 10 minutos. Puede haber ligeras molestias, hematoma o hinchazn en el sitio de la insercin durante un par [employer]. El implante comienza a funcionar dentro del Social worker. Durante 2 semanas se debe utilizar otra proteccin anticonceptiva. Haga un seguimiento con su mdico para volver a controlarse, segn las indicaciones.  Su mdico se asegurar de que usted es una buena candidata para el anticonceptivo implantable. Converse con su mdico acerca de los posibles efectos secundarios del implante.  VENTAJAS   Evita el embarazo durante un mximo de 3 aos.   Es fcilmente reversible.   Es conveniente.   La progestina protege contra el cncer de tero y de ovario.   Se puede utilizar Visual merchandiser.   Puede ser utilizado por mujeres que no pueden tomar [employer].  DESVENTAJAS   Es posible que tenga un sangrado vaginal irregular o imprevisto.   Puede sufrir efectos secundarios como aumento de [place], Engineer, mining de [place], [place], sensibilidad en las mamas o cambios de humor.   Es posible que sufra un dao en los tejidos o en los nervios despus de la insercin (raro).   Puede ser difcil e incmodo de retirar.   Algunos medicamentos pueden  interferir con la eficacia de los implantes.  EXTRACCIN DEL IMPLANTE  El implante debe ser removido a los 3 aos o segn las indicaciones de su mdico. Su efecto desaparece en algunas horas despus de la extraccin. La capacidad de quedar embarazada (fertilidad) se restablece en un par de semanas. Si lo desea, podr colocarse un nuevo implante tan pronto como se retire el viejo.  NO SE COLOQUE EL IMPLANTE SI:   Est embarazada.   Tiene una historia clnica de cncer de mama, osteoporosis, cogulos sanguneos, enfermedades cardacas, diabetes, hipertensin arterial, enfermedades hepticas, tumores o ictus.    Tiene una hemorragia vaginal sin diagnosticar.   Es hipersensible a ciertas partes del implante.  Document Released: 08/28/2011 [facility] Patient Information 2012 [place], [place].Amamantar al beb (Breastfeeding) LOS BENEFICIOS DE AMAMANTAR Para el beb  La primera leche (calostro ) ayuda al mejor funcionamiento del sistema digestivo del beb.   La leche tiene anticuerpos que provienen de la madre y que ayudan a prevenir las infecciones en el beb.   Hay una menor incidencia de asma, enfermedades alrgicas y SMSI (sndrome de muerte sbita nfantil).   Los nutrientes que contiene la [place] materna son mejores que las frmulas para el bibern y favorecen el desarrollo cerebral.   Los bebs amamantados sufren menos gases, clicos y constipacin.  Para la mam  La lactancia materna favorece el desarrollo de un vnculo muy especial entre la madre y el beb.   Es ms conveniente, siempre disponible a la Optician, dispensing y ms econmica que la [employer]   Consume caloras en la madre y la ayuda a perder el peso ganado [place]  embarazo.   Favorece la contraccin del tero a su tamao normal, de manera ms rpida y [employer] las hemorragias luego del [place].   Las [employer] que amamantan tienen menor riesgo de Geophysical data processor de mama.  AMAMNTELO CON FRECUENCIA  Un  beb sano, nacido a trmino, puede amamantarse con tanta frecuencia como cada hora, o espaciar las comidas cada tres horas.   Esta frecuencia variar de un beb a otro. Observe al beb cuando manifieste signos de hambre, antes que regirse por el reloj.   Amamntelo tan seguido como el beb lo solicite, o cuando usted sienta la necesidad de Paramedic sus [place].   Despierte al beb si han pasado 3  4 horas desde la ltima comida.   El amamantamiento frecuente la ayudar a producir ms [place] y a Education officer, community de Engineer, mining en los pezones e hinchazn de las [place].  LA POSICIN DEL BEB PARA AMAMANTARLO  Ya sea que se encuentre acostada o sentada, asegrese que el abdomen del beb enfrente el suyo.   Sostenga la mama con el pulgar por arriba y el resto de los dedos por debajo. Asegrese que sus dedos se encuentren lejos del pezn y de la boca del beb.   Toque suavemente los labios del beb y la mejilla ms cercana a la mama con el dedo o el pezn.   Cuando la boca del beb se abra lo suficiente, introduzca el pezn y la zona oscura que lo rodea tanto como le sea posible dentro de la boca.   Coloque a beb cerca suyo de modo que su nariz y mejillas toquen las mamas al [employer].  LAS COMIDAS  La duracin de cada comida vara de un beb a otro y de [place] comida a [place].   El beb debe succionar alrededor [employer] o tres minutos para que le llegue [place]. Esto se denomina "bajada". Por este motivo, permita que el nio se alimente en cada mama todo lo que desee. Terminar de mamar cuando haya recibido la cantidad [place] de nutrientes.   Para detener la succin coloque su dedo en la comisura de la boca del nio y Midwife entre sus encas antes de quitarle la mama de la boca. Esto la ayudar a English as a second language teacher.  REDUCIR LA CONGESTIN DE LAS MAMAS  Durante la primera semana despus del parto, usted puede experimentar [employer]. Cuando las mamas estn congestionadas, se sienten  calientes, llenas y molestas al tacto. Puede reducir la congestin si:   Lo amamanta frecuentemente, cada 2-3 horas. Las mams que [employer] pronto y con frecuencia tienen menos problemas de [place].   Coloque bolsas fras livianas entre cada [place]. Esto ayuda a Building services engineer. Envuelva las bolsas de hielo en una toalla liviana para proteger su piel.   Aplique compresas hmedas calientes [employer] durante 5 a 10 minutos antes de amamantar al [employer]. Esto aumenta la circulacin y [place] a que la [place].   Masajee suavemente la mama antes y Psychologist, sport and exercise.   Asegrese que el nio vaca al menos una mama antes de cambiar de lado.   Use un sacaleche para vaciar la mama si el beb se duerme o no se alimenta bien. Tambin podr [employer] con esta bomba si tiene que volver al trabajo o siente que las mamas estn congestionadas.   Evite los biberones, chupetes o complementar la alimentacin con agua o jugos en lugar de la [place].   Verifique que el beb  se encuentra en la posicin correcta mientras lo alimenta.   Evite el cansancio, el estrs y la anemia   Use un soutien que sostenga bien sus mamas y evite los que tienen aro.   Consuma una dieta balanceada y beba lquidos en cantidad.  Si sigue estas indicaciones, la congestin debe mejorar en 24 a 48 horas. Si an tiene dificultades, consulte a Barista. TENDR SUFICIENTE LECHE MI BEB? Algunas veces las madres se preocupan acerca de si sus bebs tendrn la leche suficiente. Puede asegurarse que el beb tiene la leche suficiente si:  El beb succiona y escucha que traga activamente.   El nio se alimenta al menos 8 a 12 veces en 24 horas. Alimntelo hasta que se desprenda por sus propios medios o se quede dormido en la primera mama (al menos durante 10 a 20 minutos), luego ofrzcale el otro lado.   El beb moja 5 a 6 paales descartables (6 a 8 paales de tela) en 24 horas cuando tiene 5   6 das de vida.   Tiene al menos 2-3 deposiciones todos los [employer] primeros meses. La leche materna es todo el alimento que el beb necesita. No es necesario que el nio ingiera agua o preparados de bibern. De hecho, para ayudar a que sus mamas produzcan ms [place], lo mejor es no darle al beb suplementos durante las primeras semanas.   La materia fecal debe ser blanda y [place].   El beb debe aumentar 112 a 196 g por semana.  CUDESE Cuide sus mamas del siguiente modo:  Bese o dchese diariamente.   No lave sus pezones con jabn.   Comience a amamantar del lado izquierdo en una comida y del lado derecho en la siguiente.   Notar que [employer] suministro de [place] a los 2 a 5 [address]  [address] despus del [place]. Puede sentir algunas molestias por la congestin, lo que hace que sus mamas estn duras y sensibles. La congestin disminuye en 24 a 48 horas. Mientras tanto, aplique toallas hmedas calientes durante 5 a 10 minutos antes de amamantar. Un masaje suave y la extraccin de un poco de leche antes de Museum/gallery exhibitions officer ablandarn las mamas y har ms fcil que el beb se agarre. Use un buen sostn y seque al aire los pezones durante 10 a 15 minutos luego de cada alimentacin.   Solo utilice apsitos de algodn.   Utilice lanolina [employer] pezones luego de [place]. No necesita lavarlos luego de alimentar al [employer].  Cudese del siguiente modo:   Consuma alimentos bien balanceados y refrigerios nutritivos.   Dixie Dials, jugos de fruta y agua para Warehouse manager sed (alrededor de 8 vasos por Futures trader).   Descanse lo suficiente.   Aumente la ingesta de calcio en la dieta (1200mg /da).   Evite los alimentos que usted nota que puedan afectar al beb.  SOLICITE ATENCIN MDICA SI:  Tiene preguntas que formular o dificultades con la alimentacin a pecho.   Necesita ayuda.   Observa una zona dura, roja y que le duele en la zona de la mama, y se acompaa de fiebre de 100.5 F (38.1 C) o ms.    El beb est muy somnoliento como para alimentarse bien o tiene problemas para dormir.   El beb moja menos de 6 paales por da, a partir de los [address] de [place].   La piel del beb o la parte blanca de sus ojos est ms amarilla de lo que estaba en el  hospital.   Se siente deprimida.  Document Released: 09/08/2005 Document Revised: 08/28/2011 [facility] Patient Information 2012 [place], [place].

## 2012-05-28 LAB — HIV ANTIBODY (ROUTINE TESTING W REFLEX): HIV: NONREACTIVE

## 2012-05-31 ENCOUNTER — Telehealth: Payer: Self-pay | Admitting: *Deleted

## 2012-05-31 DIAGNOSIS — O09A0 Supervision of pregnancy with history of molar pregnancy, unspecified trimester: Secondary | ICD-10-CM

## 2012-05-31 DIAGNOSIS — O0990 Supervision of high risk pregnancy, unspecified, unspecified trimester: Secondary | ICD-10-CM

## 2012-05-31 NOTE — Telephone Encounter (Signed)
Message copied by Mannie Stabile on Mon May 31, 2012  9:51 AM ------      Message from: Lesly Dukes      Created: Fri May 28, 2012 10:20 AM       Failed 1 hour.  Needs 3 hour

## 2012-06-01 NOTE — Telephone Encounter (Signed)
Called pt w/Rebecca Ayala- interpreter. Message was left that we are calling with test results. We will call back- no emergency.

## 2012-06-02 NOTE — Telephone Encounter (Signed)
Pt informed of results. She will come in on Friday 9/13 for 3 hr test.

## 2012-06-04 ENCOUNTER — Other Ambulatory Visit: Payer: Self-pay

## 2012-06-04 DIAGNOSIS — O0990 Supervision of high risk pregnancy, unspecified, unspecified trimester: Secondary | ICD-10-CM

## 2012-06-05 LAB — GLUCOSE TOLERANCE, 3 HOURS
Glucose Tolerance, 1 hour: 196 mg/dL — ABNORMAL HIGH (ref 70–189)
Glucose Tolerance, 2 hour: 179 mg/dL — ABNORMAL HIGH (ref 70–164)
Glucose Tolerance, Fasting: 84 mg/dL (ref 70–104)
Glucose, GTT - 3 Hour: 157 mg/dL — ABNORMAL HIGH (ref 70–144)

## 2012-06-07 ENCOUNTER — Encounter: Payer: Self-pay | Admitting: Obstetrics & Gynecology

## 2012-06-07 DIAGNOSIS — O99810 Abnormal glucose complicating pregnancy: Secondary | ICD-10-CM | POA: Insufficient documentation

## 2012-06-17 ENCOUNTER — Ambulatory Visit (INDEPENDENT_AMBULATORY_CARE_PROVIDER_SITE_OTHER): Payer: Self-pay | Admitting: Obstetrics and Gynecology

## 2012-06-17 ENCOUNTER — Encounter: Payer: Self-pay | Admitting: Obstetrics and Gynecology

## 2012-06-17 VITALS — BP 120/70 | Temp 97.2°F | Wt 161.2 lb

## 2012-06-17 DIAGNOSIS — O24919 Unspecified diabetes mellitus in pregnancy, unspecified trimester: Secondary | ICD-10-CM

## 2012-06-17 DIAGNOSIS — B373 Candidiasis of vulva and vagina: Secondary | ICD-10-CM

## 2012-06-17 LAB — POCT URINALYSIS DIP (DEVICE)
Glucose, UA: NEGATIVE mg/dL
Leukocytes, UA: NEGATIVE
Protein, ur: 30 mg/dL — AB
Specific Gravity, Urine: >=1.03 (ref 1.005–1.030)
Urobilinogen, UA: 0.2 mg/dL (ref 0.0–1.0)

## 2012-06-17 MED ORDER — FLUCONAZOLE 150 MG PO TABS: 150.0000 mg | ORAL_TABLET | Freq: Once | ORAL | Status: DC

## 2012-06-17 NOTE — Addendum Note (Signed)
Addended by: Jill Side on: 06/17/2012 04:53 PM   Modules accepted: Orders

## 2012-06-17 NOTE — Addendum Note (Signed)
Addended by: Caren Griffins C on: 06/17/2012 12:03 PM   Modules accepted: Orders

## 2012-06-17 NOTE — Progress Notes (Signed)
Pulse: 89 Pt has already had her flu shot

## 2012-06-17 NOTE — Progress Notes (Signed)
Abnormal 3 hr OGTT 06/04/12. Discussed diet. Will schedule [place] and testing based on CBGs when determine class GDM. Kick counts reviewed. Has thick white vaginal discharge and vulvovaginal pruritis.  Rx Diflucan.

## 2012-06-17 NOTE — Patient Instructions (Signed)
Diabetes mellitus gestacional (Gestational Diabetes Mellitus) La diabetes mellitus gestacional se produce slo durante el embarazo. Aparece cuando el organismo no puede controlar adecuadamente la glucosa (azcar) que aumenta en la sangre despus de comer. Durante el embarazo, se produce una resistencia a la insulina (sensibilidad reducida a la insulina) debido a la liberacin de hormonas por parte de la placenta. Generalmente, el pncreas de una mujer embarazada produce la cantidad suficiente de insulina para vencer esa resistencia. Sin embargo, en la diabetes gestacional, hay insulina pero no cumple su funcin adecuadamente. Si la resistencia es lo suficientemente grave como para que el pncreas no produzca la cantidad de insulina suficiente, la glucosa extra se acumula en la sangre.  QUINES TIENEN RIESGO DE DESARROLLAR DIABETES GESTACIONAL?  Las mujeres con historia de diabetes en la familia.   Las mujeres de ms de 25 aos.   Las que presentan sobrepeso.   Las mujeres que pertenecen a ciertos grupos tnicos (latinas, afroamericanas, norteamericanas nativas, asiticas y las originarias de las islas del [employer].  QUE PUEDE OCURRIRLE AL BEB? Si el nivel de glucosa en sangre de la madre es demasiado elevado mientras este embarazada, el nivel extra de azcar pasar por el cordn umbilical hacia el beb. Algunos de los problemas del beb pueden ser:  Beb demasiado grande: si el nio recibe demasiada azcar, puede aumentar mucho de peso. Esto puede hacer que sea demasiado grande para nacer por parto normal (vaginal) por lo que ser necesario realizar una cesrea.   Bajo nivel de glucosa (hipoglucemia): el beb produce insulina extra en respuesta a la excesiva cantidad de azcar que obtiene de la madre. Cuando el beb nace y ya no necesita insulina extra, su nivel de azcar en sangre puede disminuir.   Ictericia (coloracin amarillenta de la piel y los ojos): esto es bastante frecuente en los  bebs. La causa es la acumulacin de una sustancia qumica denominada bilirrubina. No siempre es un trastorno grave, pero se observa con frecuencia en los bebs cuyas madres sufren diabetes gestacional.  RIESGOS PARA LA MADRE Las mujeres que han sufrido diabetes gestacional pueden tener ms riesgos para algunos problemas como:  Preeclampsia o toxemia, incluyendo problemas con hipertensin arterial. La presin arterial y los niveles de protenas en la orina deben controlarse con frecuencia.   Infecciones   Parto por cesrea.   Aparicin de diabetes tipo 2 en una etapa posterior de la vida. Alrededor del 30% al 50% sufrir diabetes posteriormente, especialmente las que son obesas.  DIAGNSTICO Las hormonas que causan resistencia a la insulina tienen su mayor nivel alrededor de las 24 a 28 semanas del embarazo. Si se experimentan sntomas, stos son similares a los sntomas que normalmente aparecen durante el embarazo.  La diabetes mellitus gestacional generalmente se diagnostica por medio de un mtodo en dos partes: 1. Despus de la 24 a 28 semanas de embarazo, la mujer debe beber una solucin que contiene glucosa y realizar un anlisis de sangre. Si el nivel de glucosa es elevado, la realizarn un segundo anlisis.  2. La prueba oral de tolerancia a la glucosa, que dura aproximadamente tres horas. Despus de realizar ayuno durante la noche, se controla nivel de glucosa en sangre. La mujer bebe una solucin que contiene glucosa y le realizan anlisis de glucosa en sangre cada hora.  Si la mujer tiene factores de riesgos para la diabetes mellitus gestacional, el mdico podr indicar el anlisis antes de las 24 semanas de embarazo. TRATAMIENTO El tratamiento est dirigido a mantener la glucosa en   sangre de la madre en un nivel normal y puede incluir:  La planificacin de los alimentos.   Recibir insulina u otro medicamento para controlar el nivel de glucosa en sangre.   La prctica de ejercicios.    Llevar un registro diario de los alimentos que consume.   Control y registro de los niveles de glucosa en sangre.   Control de los niveles de cetona en la orina, aunque esto ya no se considera necesario en la mayora de los embarazos.  INSTRUCCIONES PARA EL CUIDADO DOMICILIARIO Mientras est embarazada:  Siga los consejos de su mdico relacionados con los controles prenatales, la planificacin de la comida, la actividad fsica, los medicamentos, vitaminas, los anlisis de sangre y otras pruebas y las actividades fsicas.   Lleve un registro de las comidas, las pruebas de glucosa en sangre y la cantidad de insulina que recibe (si corresponde). Muestre todo al profesional en cada consulta mdica prenatal.   Si sufre diabetes mellitus gestacional, podr tener problemas de hipoglucemia (nivel bajo de glucosa en sangre). Podr sospechar este problema si se siente repentinamente mareada, tiene temblores y/o se siente dbil. Si cree que esto le est ocurriendo, y tiene un medidor de glucosa, mida su nivel de glucosa en sangre. Siga los consejos de su mdico sobre el modo y el momento de tratar su nivel de glucosa en sangre. Generalmente se sigue la regla 15:15 Consuma 15 g de hidratos de carbono, espere 15 minutos y vuelva controlar el nivel de glucosa en sangre.. Ejemplos de 15 g de hidratos de carbono son:   1 taza de leche descremada.    taza de jugo.   3-4 tabletas de glucosa.   5-6 caramelos duros.   1 caja pequea de pasas de uva.    taza de gaseosa comn.   Mantenga una buena higiene para evitar infecciones.   No fume.  SOLICITE ATENCIN MDICA SI:  Observa prdida vaginal con o sin picazn.   Se siente ms dbil o cansada que lo habitual.   Transpira mucho.   Tiene un aumento de peso repentino, 2,5 kg o ms en una semana.   Pierde peso, 1.5 kg o ms en una semana.   Su nivel de glucosa en sangre es elevado, necesita instrucciones.  SOLICITE ATENCIN MDICA DE  INMEDIATO SI:  Sufre una cefalea intensa.   Se marea o pierde el conocimiento   Presenta nuseas o vmitos.   Se siente desorientada confundida.   Sufre convulsiones.   Tiene problemas de visin.   Siente dolor en el estmago.   Presenta una hemorragia vaginal abundante.   Tiene contracciones uterinas.   Tiene una prdida importante de lquido por la vagina  DESPUS QUE NACE EL BEB:  Concurra a todos los controles de seguimiento y realice los anlisis de sangre segn las indicaciones de su mdico.   Mantenga un estilo de vida saludable para evitar la diabetes en el futuro. Aqu se incluye:   Siga el plan de alimentacin saludable.   Controle su peso.   Practique actividad fsica y descanse lo necesario.   No fume.   Amamante a su beb mientras pueda. Esto disminuir la probabilidad de que usted y su beb sufran diabetes posteriormente.  Para ms informacin acerca de la diabetes, visite la pgina web de la American Diabetes Association: www.americandiabetesassociation.org. Para ms informacin acerca de la diabetes gestacional cite la pgina web del American Congress of Obst[employer]ians and Gynecologists en: www.acog.org. Document Released: 06/18/2005 Document Revised: 08/28/2011 [employer] Patient Information 2012   [employer]. 

## 2012-06-21 ENCOUNTER — Encounter: Payer: Self-pay | Attending: Family Medicine | Admitting: Dietician

## 2012-06-21 ENCOUNTER — Ambulatory Visit (INDEPENDENT_AMBULATORY_CARE_PROVIDER_SITE_OTHER): Payer: Self-pay | Admitting: Obstetrics & Gynecology

## 2012-06-21 VITALS — Wt 158.3 lb

## 2012-06-21 DIAGNOSIS — O99810 Abnormal glucose complicating pregnancy: Secondary | ICD-10-CM

## 2012-06-21 DIAGNOSIS — Z713 Dietary counseling and surveillance: Secondary | ICD-10-CM | POA: Insufficient documentation

## 2012-06-21 NOTE — Progress Notes (Signed)
Diabetes Education:  Seen today for GDM meter and instruction.  Provided a True Track meter Lot: [id number]  EXP: 2014/04/21  And 1 box strips Lot: [id number] Exp: 2014/08/19  And 1 box Lancets Lot: [id number] Exp: 2016/02/23.  On return demonstration of the BGM process, she had a 2 hour post meal blood glucose of 149.  Breakfast was 1/2 slice bread and coffee with cream and sugar.  Instructed to omit the sugar and use Splenda for the sugar substitute.  Instructed to bring her meter and blood glucose log to all clinic appointments.  Assisted in the teaching by Trinna Post the Veterinary surgeon.  Maggie Nikolaus Pienta, RN, CDE

## 2012-06-21 NOTE — Progress Notes (Signed)
Nutrition note: 1st consult- RD/ CDE only Pt is obese and was seen for GDM diet education. Pt has gained 9.3# @ 103w2d, which is wnl.  Pt reports eating 2 meals & 1-2 snacks/ d. Pt drinks water, a little juice & milk 2x/d.  Pt is taking PNV. Pt reports no N&V, heartburn occ and NKFA. Pt reports walking occ. Pt given verbal & written GDM education in spanish.  Disc wt gain goals of 11-20# or 0.5#/ wk. Disc importance of BF & physical activity. Pt agrees to follow GDM diet including 3 meals & 3 snacks/d with proper CHO/ protein combination. Pt does receive WIC. Pt does plan to BF. F/u in 2-4 wks Blondell Reveal, MS, RD, LDN

## 2012-06-28 ENCOUNTER — Encounter: Payer: Self-pay | Admitting: Family Medicine

## 2012-07-05 ENCOUNTER — Ambulatory Visit (INDEPENDENT_AMBULATORY_CARE_PROVIDER_SITE_OTHER): Payer: Self-pay | Admitting: Family Medicine

## 2012-07-05 VITALS — BP 106/73 | Temp 96.7°F | Wt 160.0 lb

## 2012-07-05 DIAGNOSIS — O24919 Unspecified diabetes mellitus in pregnancy, unspecified trimester: Secondary | ICD-10-CM

## 2012-07-05 LAB — POCT URINALYSIS DIP (DEVICE)
Bilirubin Urine: NEGATIVE
Ketones, ur: NEGATIVE mg/dL
Specific Gravity, Urine: 1.015 (ref 1.005–1.030)

## 2012-07-05 MED ORDER — GLYBURIDE 2.5 MG PO TABS: 2.5000 mg | ORAL_TABLET | Freq: Every day | ORAL | Status: DC

## 2012-07-05 NOTE — Progress Notes (Signed)
U/S scheduled 07/12/12 at 815 am.

## 2012-07-05 NOTE — Progress Notes (Signed)
Fasting 75-98, 4/14 over 95.  PP 81-170, 19/42 over 120.  Start glyburide 2.5 mg with breakfast daily. Will likely need BID. Has ultrasound on 10/21 and f/u appointment to monitor BS with start of medication. Headache 3 days. Flashes in vision. This is not new- history of migraines that last 1 to 3 days with vision changes and sometimes nausea. Sometimes relieved with immediate tylenol. This one did not go away with immediate tylenol and has not taken anything since onset. Recommend tylenol. If not improved, call - will try reglan/phenergan. BP is normal and no proteinuria.  No bleeding, cramping.

## 2012-07-05 NOTE — Assessment & Plan Note (Signed)
Started on glyburide 10/14.

## 2012-07-05 NOTE — Patient Instructions (Signed)
Diabetes mellitus gestacional (Gestational Diabetes Mellitus) La diabetes mellitus gestacional se produce slo durante el embarazo. Aparece cuando el organismo no puede controlar adecuadamente la glucosa (azcar) que aumenta en la sangre despus de comer. Durante el embarazo, se produce una resistencia a la insulina (sensibilidad reducida a la insulina) debido a la liberacin de hormonas por parte de la placenta. Generalmente, el pncreas de una mujer embarazada produce la cantidad suficiente de insulina para vencer esa resistencia. Sin embargo, en la diabetes gestacional, hay insulina pero no cumple su funcin adecuadamente. Si la resistencia es lo suficientemente grave como para que el pncreas no produzca la cantidad de insulina suficiente, la glucosa extra se acumula en la sangre.  QUINES TIENEN RIESGO DE DESARROLLAR DIABETES GESTACIONAL?  Las mujeres con historia de diabetes en la familia.  Las mujeres de ms de 25 aos.  Las que presentan sobrepeso.  Las mujeres que pertenecen a ciertos grupos tnicos (latinas, afroamericanas, norteamericanas nativas, asiticas y las originarias de las islas del [employer]. QUE PUEDE OCURRIRLE AL BEB? Si el nivel de glucosa en sangre de la madre es demasiado elevado mientras este embarazada, el nivel extra de azcar pasar por el cordn umbilical hacia el beb. Algunos de los problemas del beb pueden ser:  Beb demasiado grande: si el nio recibe demasiada azcar, puede aumentar mucho de peso. Esto puede hacer que sea demasiado grande para nacer por parto normal (vaginal) por lo que ser necesario realizar una cesrea.  Bajo nivel de glucosa (hipoglucemia): el beb produce insulina extra en respuesta a la excesiva cantidad de azcar que obtiene de la madre. Cuando el beb nace y ya no necesita insulina extra, su nivel de azcar en sangre puede disminuir.  Ictericia (coloracin amarillenta de la piel y los ojos): esto es bastante frecuente en los bebs. La  causa es la acumulacin de una sustancia qumica denominada bilirrubina. No siempre es un trastorno grave, pero se observa con frecuencia en los bebs cuyas madres sufren diabetes gestacional. RIESGOS PARA LA MADRE Las mujeres que han sufrido diabetes gestacional pueden tener ms riesgos para algunos problemas como:  Preeclampsia o toxemia, incluyendo problemas con hipertensin arterial. La presin arterial y los niveles de protenas en la orina deben controlarse con frecuencia.  Infecciones  Parto por cesrea.  Aparicin de diabetes tipo 2 en una etapa posterior de la vida. Alrededor del 30% al 50% sufrir diabetes posteriormente, especialmente las que son obesas. DIAGNSTICO Las hormonas que causan resistencia a la insulina tienen su mayor nivel alrededor de las 24 a 28 semanas del embarazo. Si se experimentan sntomas, stos son similares a los sntomas que normalmente aparecen durante el embarazo.  La diabetes mellitus gestacional generalmente se diagnostica por medio de un mtodo en dos partes: 1. Despus de la 24 a 28 semanas de embarazo, la mujer debe beber una solucin que contiene glucosa y realizar un anlisis de sangre. Si el nivel de glucosa es elevado, la realizarn un segundo anlisis. 2. La prueba oral de tolerancia a la glucosa, que dura aproximadamente tres horas. Despus de realizar ayuno durante la noche, se controla nivel de glucosa en sangre. La mujer bebe una solucin que contiene glucosa y le realizan anlisis de glucosa en sangre cada hora. Si la mujer tiene factores de riesgos para la diabetes mellitus gestacional, el mdico podr indicar el anlisis antes de las 24 semanas de embarazo. TRATAMIENTO El tratamiento est dirigido a mantener la glucosa en sangre de la madre en un nivel normal y puede incluir:  La   planificacin de los alimentos.  Recibir insulina u otro medicamento para controlar el nivel de glucosa en sangre.  La prctica de ejercicios.  Llevar un  registro diario de los alimentos que consume.  Control y registro de los niveles de glucosa en sangre.  Control de los niveles de cetona en la orina, aunque esto ya no se considera necesario en la mayora de los embarazos. INSTRUCCIONES PARA EL CUIDADO DOMICILIARIO Mientras est embarazada:  Siga los consejos de su mdico relacionados con los controles prenatales, la planificacin de la comida, la actividad fsica, los medicamentos, vitaminas, los anlisis de sangre y otras pruebas y las actividades fsicas.  Lleve un registro de las comidas, las pruebas de glucosa en sangre y la cantidad de insulina que recibe (si corresponde). Muestre todo al profesional en cada consulta mdica prenatal.  Si sufre diabetes mellitus gestacional, podr tener problemas de hipoglucemia (nivel bajo de glucosa en sangre). Podr sospechar este problema si se siente repentinamente mareada, tiene temblores y/o se siente dbil. Si cree que esto le est ocurriendo, y tiene un medidor de glucosa, mida su nivel de glucosa en sangre. Siga los consejos de su mdico sobre el modo y el momento de tratar su nivel de glucosa en sangre. Generalmente se sigue la regla 15:15 Consuma 15 g de hidratos de carbono, espere 15 minutos y vuelva controlar el nivel de glucosa en sangre.. Ejemplos de 15 g de hidratos de carbono son:  1 taza de leche descremada.   taza de jugo.  3-4 tabletas de glucosa.  5-6 caramelos duros.  1 caja pequea de pasas de uva.   taza de gaseosa comn.  Mantenga una buena higiene para evitar infecciones.  No fume. SOLICITE ATENCIN MDICA SI:  Observa prdida vaginal con o sin picazn.  Se siente ms dbil o cansada que lo habitual.  Transpira mucho.  Tiene un aumento de peso repentino, 2,5 kg o ms en una semana.  Pierde peso, 1.5 kg o ms en una semana.  Su nivel de glucosa en sangre es elevado, necesita instrucciones. SOLICITE ATENCIN MDICA DE INMEDIATO SI:  Sufre una cefalea  intensa.  Se marea o pierde el conocimiento  Presenta nuseas o vmitos.  Se siente desorientada confundida.  Sufre convulsiones.  Tiene problemas de visin.  Siente dolor en el estmago.  Presenta una hemorragia vaginal abundante.  Tiene contracciones uterinas.  Tiene una prdida importante de lquido por la vagina DESPUS QUE NACE EL BEB:  Concurra a todos los controles de seguimiento y realice los anlisis de sangre segn las indicaciones de su mdico.  Mantenga un estilo de vida saludable para evitar la diabetes en el futuro. Aqu se incluye:  Siga el plan de alimentacin saludable.  Controle su peso.  Practique actividad fsica y descanse lo necesario.  No fume.  Amamante a su beb mientras pueda. Esto disminuir la probabilidad de que usted y su beb sufran diabetes posteriormente. Para ms informacin acerca de la diabetes, visite la pgina web de la American Diabetes Association: www.americandiabetesassociation.org. Para ms informacin acerca de la diabetes gestacional cite la pgina web del American [employer]nec[employer] en: www.acog.org. Document Released: 06/18/2005 Document Revised: 12/01/2011 [employer] Patient Information 2013 [employer].   Embarazo  Tercer trimestre  (Pregnancy - Third Trimester) El tercer trimestre del embarazo (los ltimos 3 meses) es el perodo en el cual tanto usted como su beb crecen con ms rapidez. El beb alcanza un largo de aproximadamente 50 cm. y pesa entre 2,700 y 4,500 kg.   El beb gana ms tejido graso y est listo para la vida fuera del cuerpo de la madre. Mientras estn en el interior, los bebs tienen perodos de sueo y vigilia, succionan el pulgar y tienen hipo. Quizs sienta pequeas contracciones del tero. Este es el falso trabajo de parto. Tambin se las conoce como contracciones de Braxton-Hicks . Es como una prctica del parto. Los problemas ms habituales de esta etapa del embarazo incluyen  mayor dificultad para respirar, hinchazn de las manos y los pies por retencin de lquidos y la necesidad de orinar con ms frecuencia debido a que el tero y el beb presionan sobre la vejiga.  EXAMENES PRENATALES   Durante los exmenes prenatales, deber seguir realizndose anlisis de sangre. Estas pruebas se realizan para controlar su salud y la del beb. Los anlisis de sangre se realizan para conocer los niveles de algunos compuestos de la sangre (hemoglobina). La anemia (bajo nivel de hemoglobina) es frecuente durante el embarazo. Para prevenirla, se administran hierro y vitaminas. Tambin le tomarn nuevas anlisis para descartar diabetes. Podrn repetirle algunas de las pruebas que le hicieron previamente.  En cada visita le medirn el tamao del tero. Esto permite asegurar que el beb se desarrolla adecuadamente, segn la fecha del embarazo.  Le controlarn la presin arterial en cada visita prenatal. Esto es para asegurarse de que no sufre toxemia.  Le harn un anlisis de orina en cada visita prenatal, para descartar infecciones, diabetes y la presencia de protenas.  Tambin en cada visita controlarn su peso. Esto se realiza para asegurarse que aumenta de peso al ritmo indicado y que usted y su beb evolucionan normalmente.  En algunas ocasiones se realiza una prueba de ultrasonido para confirmar el correcto desarrollo y evolucin del beb. Esta prueba se realiza con ondas sonoras inofensivas para el beb, de modo que el profesional pueda calcular ms precisamente la fecha del parto.  Analice con su mdico los analgsicos y la anestesia que recibir durante el trabajo de parto y el parto.  Comente la posibilidad de que necesite una cesrea y qu anestesia se recibir.  Informe a su mdico si sufre violencia familiar mental o fsica. A veces, se indica la prueba especializada sin estrs, la prueba de tolerancia a las contracciones y el perfil biofsico para asegurarse de que el  beb no tiene problemas. El estudio del lquido amnitico que rodea al beb se llama amniocentesis. El lquido amnitico se obtiene introduciendo una aguja en el vientre (abdomen ). En ocasiones se lleva a cabo cerca del final del embarazo, si es necesario inducir a un parto. En este caso se realiza para asegurarse que los pulmones del beb estn lo suficientemente maduros como para que pueda vivir fuera del tero. Si los pulmones no han madurado y es peligroso que el beb nazca, se administrar a la madre una inyeccin de cortisona , 1 a 2 das antes del parto. . Esto ayuda a que los pulmones del beb maduren y sea ms seguro su nacimiento.  CAMBIOS QUE OCURREN EN EL TERCER TRIMESTRE DEL EMBARAZO  Su organismo atravesar numerosos cambios durante el embarazo. Estos pueden variar de una persona a otra. Converse con el profesional que la asiste acerca los cambios que usted note y que la preocupen.   Durante el ltimo trimestre probablemente sienta un aumento del apetito. Es normal tener "antojos" de ciertas comidas. Esto vara de una persona a otra y de un embarazo a otro.  Podrn aparecer las primeras estras en las caderas, abdomen   y mamas. Estos son cambios normales del cuerpo durante el embarazo. No existen medicamentos ni ejercicios que puedan prevenir estos cambios.  La constipacin puede tratarse con un laxante o agregando fibra a su dieta. Beber grandes cantidades de lquidos, tomar fibras en forma de vegetales, frutas y granos integrales es de gran ayuda.  Tambin es beneficioso practicar actividad fsica. Si ha sido una persona activa hasta el embarazo, podr continuar con la mayora de las actividades durante el mismo. Si ha sido menos activa, puede ser beneficioso que comience con un programa de ejercicios, como realizar caminatas. Consulte con el profesional que la asiste antes de comenzar un programa de ejercicios.  Evite el consumo de cigarrillos, el alcohol, los medicamentos no recetados y  las "drogas de la calle" durante el embarazo. Estas sustancias qumicas afectan la formacin y el desarrollo del beb. Evite estas sustancias durante todo el embarazo para asegurar el nacimiento de un beb sano.  Podr sentir dolor de espalda, tener vrices en las venas y hemorroides, o si ya los sufra, pueden empeorar.  Durante el tercer trimestre se cansar con ms facilidad, lo cual es normal.  Los movimientos del beb pueden ser ms fuertes y con ms frecuencia.  Puede que note dificultades para respirar normalmente.  El ombligo puede salir hacia afuera.  A veces sale una secrecin amarilla de las mamas, que se llama calostro.  Podr aparecer una secrecin mucosa con sangre. Esto suele ocurrir entre unos pocos das y una semana antes del parto. INSTRUCCIONES PARA EL CUIDADO EN EL HOGAR   Cumpla con las citas de control. Siga las indicaciones del mdico con respecto al uso de medicamentos, los ejercicios y la dieta.  Durante el embarazo debe obtener nutrientes para usted y para su beb. Consuma alimentos balanceados a intervalos regulares. Elija alimentos como carne, pescado, leche y otros productos lcteos descremados, vegetales, frutas, panes integrales y cereales. El mdico le informar cul es el aumento de peso ideal.  Las relaciones sexuales pueden continuarse hasta casi el final del embarazo, si no se presentan otros problemas como prdida prematura (antes de tiempo) de lquido amnitico, hemorragia vaginal o dolor en el vientre (abdominal).  Realice actividad fsica todos los das, si no tiene restricciones. Consulte con el profesional que la asiste si no sabe con certeza si determinados ejercicios son seguros. El mayor aumento de peso se producir en los ltimos 2 trimestres del embarazo. El ejercicio ayuda a:  Controlar su peso.  Mantenerse en forma para el trabajo de parto y el parto .  Perder peso despus del parto.  Haga reposo con frecuencia, con las piernas elevadas,  o segn lo necesite para evitar los calambres y el dolor de cintura.  Use un buen sostn o como los que se usan para hacer deportes para aliviar la sensibilidad de las mamas. Tambin puede serle til si lo usa mientras duerme. Si pierde calostro, podr utilizar apsitos en el sostn.  No utilice la baera con agua caliente, baos turcos y saunas.  Colquese el cinturn de seguridad cuando conduzca. Este la proteger a usted y al beb en caso de accidente.  Evite comer carne cruda y el contacto con los utensilios y desperdicios de los gatos. Estos elementos contienen grmenes que pueden causar defectos de nacimiento en el beb.  Es fcil perder algo de orina durante el embarazo. Apretar y fortalecer los msculos de la pelvis la ayudar con este problema. Practique detener la miccin cuando est en el bao. Estos son los mismos msculos   que necesita fortalecer. Son tambin los mismos msculos que utiliza cuando trata de evitar despedir gases. Puede practicar apretando estos msculos diez veces, y repetir esto tres veces por da aproximadamente. Una vez que conozca qu msculos debe apretar, no realice estos ejercicios durante la miccin. Puede favorecerle una infeccin si la orina vuelve hacia atrs.  Pida ayuda si tienen necesidades financieras, teraputicas o nutricionales. El profesional podr ayudarla con respecto a estas necesidades, o derivarla a otros especialistas.  Haga una lista de nmeros telefnicos de emergencia y tngalos disponibles.  Planifique como obtener ayuda de familiares o amigos cuando regrese a casa desde el hospital.  Hacer un ensayo sobre la partida al hospital.  Tome clases prenatales con el padre para entender, practicar y hacer preguntas sobre el trabajo de parto y el alumbramiento.  Preparar la habitacin del beb / busque una guardera.  No viaje fuera de la ciudad a menos que sea absolutamente necesario y con el asesoramiento de su mdico.  Use slo zapatos de  tacn bajo o sin tacn para tener mejor equilibrio y evitar cadas. USO DE MEDICAMENTOS Y CONSUMO DE DROGAS DURANTE EL EMBARAZO   Tome las vitaminas apropiadas para esta etapa tal como se le indic. Las vitaminas deben contener un miligramo de cido flico. Guarde todas las vitaminas fuera del alcance de los nios. La ingestin de slo un par de vitaminas o tabletas que contengan hierro pueden ocasionar la muerte en un beb o en un nio pequeo.  Evite el uso de todos los medicamentos, incluyendo hierbas, medicamentos de venta libre, sin receta o que no hayan sido sugeridos por su mdico. Slo tome medicamentos de venta libre o medicamentos recetados para el dolor, el malestar o fiebre como lo indique su mdico. No tome aspirina, ibuprofeno (Motrin, Advil, Nuprin) o naproxeno (Aleve) excepto que su mdico se lo indique.  Infrmele al profesional si consume alguna droga.  El alcohol se relaciona con ciertos defectos congnitos. Incluye el sndrome de alcoholismo fetal. Debe evitar absolutamente el consumo de alcohol, en cualquier forma. El fumar produce baja tasa de natalidad y bebs prematuros.  Las drogas ilegales o de la calle son muy perjudiciales para el beb. Estn absolutamente prohibidas. Un beb que nace de una madre adicta, ser adicto al nacer. Ese beb tendr los mismos sntomas de abstinencia que un adulto. SOLICITE ATENCIN MDICA SI:  Tiene preguntas o preocupaciones relacionadas con el embarazo. Es mejor que llame para formular las preguntas si no puede esperar hasta la prxima visita, que sentirse preocupada por ellas.  DECISIONES ACERCA DE LA CIRCUNCISIN  Usted puede saber o no cul es el sexo de su beb. Si ya sabe que ser un varn, este es el momento de pensar acerca de la circuncisin. La circuncisin es la extirpacin del prepucio. Esta es la piel que cubre el extremo sensible del pene. No hay un motivo mdico que lo justifique. Generalmente la decisin se toma segn lo que  sea popular en ese momento, o segn creencias religiosas. Podr conversar estos temas con su mdico o con el pediatra.  SOLICITE ATENCIN MDICA DE INMEDIATO SI:   La temperatura oral le sube a ms de 102 F (38.9 C) o lo que su mdico le indique.  Tiene una prdida de lquido por la vagina (canal de parto). Si sospecha una ruptura de las membranas, tmese la temperatura y llame al profesional para informarlo sobre esto.  Observa unas pequeas manchas, una hemorragia vaginal o elimina cogulos. Notifique al profesional acerca de la   cantidad y de cuntos apsitos est utilizando.  Presenta un olor desagradable en la secrecin vaginal y observa un cambio en el color, de transparente a blanco.  Ha vomitado durante ms de 24 horas.  Siente escalofros o le sube la fiebre.  Le falta el aire.  Siente ardor al orinar.  Baja o sube ms de 2 libras (900 g), o segn lo indicado por el profesional que la asiste.  Observa que sbitamente se le hinchan el rostro, las manos, los pies o las piernas.  Siente dolor en el vientre (abdominal). Las molestias en el ligamento redondo son una causa benigna frecuente de dolor abdominal durante el embarazo. El profesional que la asiste deber evaluarla.  Presenta dolor de cabeza intenso que no se alivia.  Tiene problemas visuales, visin doble o borrosa.  Si no siente los movimientos del beb durante ms de 1 hora. Si piensa que el beb no se mueve tanto como lo haca habitualmente, coma algo que contenga azcar y recustese sobre el lado izquierdo durante una hora. El beb debe moverse al menos 4  5 veces por hora. Comunquese inmediatamente si el beb se mueve menos que lo indicado.  Se cae, se ve involucrada en un accidente automovilstico o sufre algn tipo de traumatismo.  En su hogar hay violencia mental o fsica. Document Released: 06/18/2005 Document Revised: 03/09/2012 [employer] Patient Information 2013 [employer].  

## 2012-07-05 NOTE — Progress Notes (Signed)
Pulse: 79

## 2012-07-12 ENCOUNTER — Ambulatory Visit (INDEPENDENT_AMBULATORY_CARE_PROVIDER_SITE_OTHER): Payer: Self-pay | Admitting: Obstetrics & Gynecology

## 2012-07-12 ENCOUNTER — Ambulatory Visit (HOSPITAL_COMMUNITY)
Admission: RE | Admit: 2012-07-12 | Discharge: 2012-07-12 | Disposition: A | Discharge status: Home / Self Care | Payer: Self-pay | Source: Ambulatory Visit | Attending: Family Medicine | Admitting: Family Medicine

## 2012-07-12 ENCOUNTER — Encounter: Payer: Self-pay | Attending: Family Medicine | Admitting: Dietician

## 2012-07-12 VITALS — BP 112/70 | Temp 96.6°F | Wt 159.9 lb

## 2012-07-12 DIAGNOSIS — O99810 Abnormal glucose complicating pregnancy: Secondary | ICD-10-CM | POA: Insufficient documentation

## 2012-07-12 DIAGNOSIS — O24919 Unspecified diabetes mellitus in pregnancy, unspecified trimester: Secondary | ICD-10-CM

## 2012-07-12 DIAGNOSIS — Z713 Dietary counseling and surveillance: Secondary | ICD-10-CM | POA: Insufficient documentation

## 2012-07-12 DIAGNOSIS — Z3689 Encounter for other specified antenatal screening: Secondary | ICD-10-CM | POA: Insufficient documentation

## 2012-07-12 LAB — POCT URINALYSIS DIP (DEVICE)
Protein, ur: NEGATIVE mg/dL
Urobilinogen, UA: 0.2 mg/dL (ref 0.0–1.0)
pH: 6.5 (ref 5.0–8.0)

## 2012-07-12 MED ORDER — GLYBURIDE 2.5 MG PO TABS: 2.5000 mg | ORAL_TABLET | Freq: Two times a day (BID) | ORAL | Status: DC

## 2012-07-12 NOTE — Progress Notes (Incomplete)
Diabetes Education:  Provide 1 box True Track Strips: [id number] Exp: 2014/08/21  And 1 box lancets Lot: [id number] Exp: 2016/02/23.  Maggie Johann Gascoigne, RN,CDE

## 2012-07-12 NOTE — Patient Instructions (Signed)
Diabetes mellitus gestacional (Gestational Diabetes Mellitus) La diabetes mellitus gestacional se produce slo durante el embarazo. Aparece cuando el organismo no puede controlar adecuadamente la glucosa (azcar) que aumenta en la sangre despus de comer. Durante el embarazo, se produce una resistencia a la insulina (sensibilidad reducida a la insulina) debido a la liberacin de hormonas por parte de la placenta. Generalmente, el pncreas de una mujer embarazada produce la cantidad suficiente de insulina para vencer esa resistencia. Sin embargo, en la diabetes gestacional, hay insulina pero no cumple su funcin adecuadamente. Si la resistencia es lo suficientemente grave como para que el pncreas no produzca la cantidad de insulina suficiente, la glucosa extra se acumula en la sangre.  QUINES TIENEN RIESGO DE DESARROLLAR DIABETES GESTACIONAL?  Las mujeres con historia de diabetes en la familia.  Las mujeres de ms de 25 aos.  Las que presentan sobrepeso.  Las mujeres que pertenecen a ciertos grupos tnicos (latinas, afroamericanas, norteamericanas nativas, asiticas y las originarias de las islas del Pac[employer]. QUE PUEDE OCURRIRLE AL BEB? Si el nivel de glucosa en sangre de la madre es demasiado elevado mientras este embarazada, el nivel extra de azcar pasar por el cordn umbilical hacia el beb. Algunos de los problemas del beb pueden ser:  Beb demasiado grande: si el nio recibe demasiada azcar, puede aumentar mucho de peso. Esto puede hacer que sea demasiado grande para nacer por parto normal (vaginal) por lo que ser necesario realizar una cesrea.  Bajo nivel de glucosa (hipoglucemia): el beb produce insulina extra en respuesta a la excesiva cantidad de azcar que obtiene de la madre. Cuando el beb nace y ya no necesita insulina extra, su nivel de azcar en sangre puede disminuir.  Ictericia (coloracin amarillenta de la piel y los ojos): esto es bastante frecuente en los bebs. La  causa es la acumulacin de una sustancia qumica denominada bilirrubina. No siempre es un trastorno grave, pero se observa con frecuencia en los bebs cuyas madres sufren diabetes gestacional. RIESGOS PARA LA MADRE Las mujeres que han sufrido diabetes gestacional pueden tener ms riesgos para algunos problemas como:  Preeclampsia o toxemia, incluyendo problemas con hipertensin arterial. La presin arterial y los niveles de protenas en la orina deben controlarse con frecuencia.  Infecciones  Parto por cesrea.  Aparicin de diabetes tipo 2 en una etapa posterior de la vida. Alrededor del 30% al 50% sufrir diabetes posteriormente, especialmente las que son obesas. DIAGNSTICO Las hormonas que causan resistencia a la insulina tienen su mayor nivel alrededor de las 24 a 28 semanas del embarazo. Si se experimentan sntomas, stos son similares a los sntomas que normalmente aparecen durante el embarazo.  La diabetes mellitus gestacional generalmente se diagnostica por medio de un mtodo en dos partes: 1. Despus de la 24 a 28 semanas de embarazo, la mujer debe beber una solucin que contiene glucosa y realizar un anlisis de sangre. Si el nivel de glucosa es elevado, la realizarn un segundo anlisis. 2. La prueba oral de tolerancia a la glucosa, que dura aproximadamente tres horas. Despus de realizar ayuno durante la noche, se controla nivel de glucosa en sangre. La mujer bebe una solucin que contiene glucosa y le realizan anlisis de glucosa en sangre cada hora. Si la mujer tiene factores de riesgos para la diabetes mellitus gestacional, el mdico podr indicar el anlisis antes de las 24 semanas de embarazo. TRATAMIENTO El tratamiento est dirigido a mantener la glucosa en sangre de la madre en un nivel normal y puede incluir:  La   planificacin de los alimentos.  Recibir insulina u otro medicamento para controlar el nivel de glucosa en sangre.  La prctica de ejercicios.  Llevar un  registro diario de los alimentos que consume.  Control y registro de los niveles de glucosa en sangre.  Control de los niveles de cetona en la orina, aunque esto ya no se considera necesario en la mayora de los embarazos. INSTRUCCIONES PARA EL CUIDADO DOMICILIARIO Mientras est embarazada:  Siga los consejos de su mdico relacionados con los controles prenatales, la planificacin de la comida, la actividad fsica, los medicamentos, vitaminas, los anlisis de sangre y otras pruebas y las actividades fsicas.  Lleve un registro de las comidas, las pruebas de glucosa en sangre y la cantidad de insulina que recibe (si corresponde). Muestre todo al profesional en cada consulta mdica prenatal.  Si sufre diabetes mellitus gestacional, podr tener problemas de hipoglucemia (nivel bajo de glucosa en sangre). Podr sospechar este problema si se siente repentinamente mareada, tiene temblores y/o se siente dbil. Si cree que esto le est ocurriendo, y tiene un medidor de glucosa, mida su nivel de glucosa en sangre. Siga los consejos de su mdico sobre el modo y el momento de tratar su nivel de glucosa en sangre. Generalmente se sigue la regla 15:15 Consuma 15 g de hidratos de carbono, espere 15 minutos y vuelva controlar el nivel de glucosa en sangre.. Ejemplos de 15 g de hidratos de carbono son:  1 taza de leche descremada.   taza de jugo.  3-4 tabletas de glucosa.  5-6 caramelos duros.  1 caja pequea de pasas de uva.   taza de gaseosa comn.  Mantenga una buena higiene para evitar infecciones.  No fume. SOLICITE ATENCIN MDICA SI:  Observa prdida vaginal con o sin picazn.  Se siente ms dbil o cansada que lo habitual.  Transpira mucho.  Tiene un aumento de peso repentino, 2,5 kg o ms en una semana.  Pierde peso, 1.5 kg o ms en una semana.  Su nivel de glucosa en sangre es elevado, necesita instrucciones. SOLICITE ATENCIN MDICA DE INMEDIATO SI:  Sufre una cefalea  intensa.  Se marea o pierde el conocimiento  Presenta nuseas o vmitos.  Se siente desorientada confundida.  Sufre convulsiones.  Tiene problemas de visin.  Siente dolor en el estmago.  Presenta una hemorragia vaginal abundante.  Tiene contracciones uterinas.  Tiene una prdida importante de lquido por la vagina DESPUS QUE NACE EL BEB:  Concurra a todos los controles de seguimiento y realice los anlisis de sangre segn las indicaciones de su mdico.  Mantenga un estilo de vida saludable para evitar la diabetes en el futuro. Aqu se incluye:  Siga el plan de alimentacin saludable.  Controle su peso.  Practique actividad fsica y descanse lo necesario.  No fume.  Amamante a su beb mientras pueda. Esto disminuir la probabilidad de que usted y su beb sufran diabetes posteriormente. Para ms informacin acerca de la diabetes, visite la pgina web de la American Diabetes Association: www.americandiabetesassociation.org. Para ms informacin acerca de la diabetes gestacional cite la pgina web del American Congress of Obstetricians and Gynecologists en: www.acog.org. Document Released: 06/18/2005 Document Revised: 12/01/2011 [employer] Patient Information 2013 [employer].  

## 2012-07-12 NOTE — Addendum Note (Signed)
Addended by: Jill Side on: 07/12/2012 11:03 AM   Modules accepted: Orders

## 2012-07-12 NOTE — Progress Notes (Signed)
Pulse: 84

## 2012-07-12 NOTE — Progress Notes (Signed)
Just started her glyburide 2 days ago FBS 104, 106 PP 102, 104, 202, 183, 99, 105. Increase to BID dosing. NST twice weekly [place] today 75%ile, nl AFI, cephalic

## 2012-07-16 ENCOUNTER — Ambulatory Visit (INDEPENDENT_AMBULATORY_CARE_PROVIDER_SITE_OTHER): Payer: Self-pay | Admitting: *Deleted

## 2012-07-16 VITALS — BP 108/65 | Wt 161.2 lb

## 2012-07-16 DIAGNOSIS — O24919 Unspecified diabetes mellitus in pregnancy, unspecified trimester: Secondary | ICD-10-CM

## 2012-07-16 NOTE — Progress Notes (Signed)
P-82 

## 2012-07-19 ENCOUNTER — Ambulatory Visit (INDEPENDENT_AMBULATORY_CARE_PROVIDER_SITE_OTHER): Payer: Self-pay | Admitting: Obstetrics & Gynecology

## 2012-07-19 VITALS — BP 115/73 | Temp 97.7°F | Wt 160.4 lb

## 2012-07-19 DIAGNOSIS — Z23 Encounter for immunization: Secondary | ICD-10-CM

## 2012-07-19 DIAGNOSIS — O24919 Unspecified diabetes mellitus in pregnancy, unspecified trimester: Secondary | ICD-10-CM

## 2012-07-19 DIAGNOSIS — O0990 Supervision of high risk pregnancy, unspecified, unspecified trimester: Secondary | ICD-10-CM

## 2012-07-19 LAB — POCT URINALYSIS DIP (DEVICE)
Bilirubin Urine: NEGATIVE
Glucose, UA: NEGATIVE mg/dL
Specific Gravity, Urine: 1.025 (ref 1.005–1.030)
Urobilinogen, UA: 0.2 mg/dL (ref 0.0–1.0)

## 2012-07-19 MED ORDER — TETANUS-DIPHTH-ACELL PERTUSSIS 5-2.5-18.5 LF-MCG/0.5 IM SUSP
0.5000 mL | Freq: Once | INTRAMUSCULAR | Status: AC
  Administered 2012-07-19: 0.5 mL via INTRAMUSCULAR

## 2012-07-19 MED ORDER — GLYBURIDE 2.5 MG PO TABS: ORAL_TABLET | ORAL | Status: DC

## 2012-07-19 NOTE — Progress Notes (Signed)
NST reactive. FBS normal, pp up to 157,212,144,146,156, Will increase to 5 mg glyburide in AM. RTC 1 week

## 2012-07-19 NOTE — Progress Notes (Signed)
Pulse- 95  Pain/pressure- just when baby moves

## 2012-07-19 NOTE — Patient Instructions (Signed)
Diabetes mellitus gestacional (Gestational Diabetes Mellitus) La diabetes mellitus gestacional se produce slo durante el embarazo. Aparece cuando el organismo no puede controlar adecuadamente la glucosa (azcar) que aumenta en la sangre despus de comer. Durante el embarazo, se produce una resistencia a la insulina (sensibilidad reducida a la insulina) debido a la liberacin de hormonas por parte de la placenta. Generalmente, el pncreas de una mujer embarazada produce la cantidad suficiente de insulina para vencer esa resistencia. Sin embargo, en la diabetes gestacional, hay insulina pero no cumple su funcin adecuadamente. Si la resistencia es lo suficientemente grave como para que el pncreas no produzca la cantidad de insulina suficiente, la glucosa extra se acumula en la sangre.  QUINES TIENEN RIESGO DE DESARROLLAR DIABETES GESTACIONAL?  Las mujeres con historia de diabetes en la familia.  Las mujeres de ms de 25 aos.  Las que presentan sobrepeso.  Las mujeres que pertenecen a ciertos grupos tnicos (latinas, afroamericanas, norteamericanas nativas, asiticas y las originarias de las islas del Pac[employer]. QUE PUEDE OCURRIRLE AL BEB? Si el nivel de glucosa en sangre de la madre es demasiado elevado mientras este embarazada, el nivel extra de azcar pasar por el cordn umbilical hacia el beb. Algunos de los problemas del beb pueden ser:  Beb demasiado grande: si el nio recibe demasiada azcar, puede aumentar mucho de peso. Esto puede hacer que sea demasiado grande para nacer por parto normal (vaginal) por lo que ser necesario realizar una cesrea.  Bajo nivel de glucosa (hipoglucemia): el beb produce insulina extra en respuesta a la excesiva cantidad de azcar que obtiene de la madre. Cuando el beb nace y ya no necesita insulina extra, su nivel de azcar en sangre puede disminuir.  Ictericia (coloracin amarillenta de la piel y los ojos): esto es bastante frecuente en los bebs. La  causa es la acumulacin de una sustancia qumica denominada bilirrubina. No siempre es un trastorno grave, pero se observa con frecuencia en los bebs cuyas madres sufren diabetes gestacional. RIESGOS PARA LA MADRE Las mujeres que han sufrido diabetes gestacional pueden tener ms riesgos para algunos problemas como:  Preeclampsia o toxemia, incluyendo problemas con hipertensin arterial. La presin arterial y los niveles de protenas en la orina deben controlarse con frecuencia.  Infecciones  Parto por cesrea.  Aparicin de diabetes tipo 2 en una etapa posterior de la vida. Alrededor del 30% al 50% sufrir diabetes posteriormente, especialmente las que son obesas. DIAGNSTICO Las hormonas que causan resistencia a la insulina tienen su mayor nivel alrededor de las 24 a 28 semanas del embarazo. Si se experimentan sntomas, stos son similares a los sntomas que normalmente aparecen durante el embarazo.  La diabetes mellitus gestacional generalmente se diagnostica por medio de un mtodo en dos partes: 1. Despus de la 24 a 28 semanas de embarazo, la mujer debe beber una solucin que contiene glucosa y realizar un anlisis de sangre. Si el nivel de glucosa es elevado, la realizarn un segundo anlisis. 2. La prueba oral de tolerancia a la glucosa, que dura aproximadamente tres horas. Despus de realizar ayuno durante la noche, se controla nivel de glucosa en sangre. La mujer bebe una solucin que contiene glucosa y le realizan anlisis de glucosa en sangre cada hora. Si la mujer tiene factores de riesgos para la diabetes mellitus gestacional, el mdico podr indicar el anlisis antes de las 24 semanas de embarazo. TRATAMIENTO El tratamiento est dirigido a mantener la glucosa en sangre de la madre en un nivel normal y puede incluir:  La   planificacin de los alimentos.  Recibir insulina u otro medicamento para controlar el nivel de glucosa en sangre.  La prctica de ejercicios.  Llevar un  registro diario de los alimentos que consume.  Control y registro de los niveles de glucosa en sangre.  Control de los niveles de cetona en la orina, aunque esto ya no se considera necesario en la mayora de los embarazos. INSTRUCCIONES PARA EL CUIDADO DOMICILIARIO Mientras est embarazada:  Siga los consejos de su mdico relacionados con los controles prenatales, la planificacin de la comida, la actividad fsica, los medicamentos, vitaminas, los anlisis de sangre y otras pruebas y las actividades fsicas.  Lleve un registro de las comidas, las pruebas de glucosa en sangre y la cantidad de insulina que recibe (si corresponde). Muestre todo al profesional en cada consulta mdica prenatal.  Si sufre diabetes mellitus gestacional, podr tener problemas de hipoglucemia (nivel bajo de glucosa en sangre). Podr sospechar este problema si se siente repentinamente mareada, tiene temblores y/o se siente dbil. Si cree que esto le est ocurriendo, y tiene un medidor de glucosa, mida su nivel de glucosa en sangre. Siga los consejos de su mdico sobre el modo y el momento de tratar su nivel de glucosa en sangre. Generalmente se sigue la regla 15:15 Consuma 15 g de hidratos de carbono, espere 15 minutos y vuelva controlar el nivel de glucosa en sangre.. Ejemplos de 15 g de hidratos de carbono son:  1 taza de leche descremada.   taza de jugo.  3-4 tabletas de glucosa.  5-6 caramelos duros.  1 caja pequea de pasas de uva.   taza de gaseosa comn.  Mantenga una buena higiene para evitar infecciones.  No fume. SOLICITE ATENCIN MDICA SI:  Observa prdida vaginal con o sin picazn.  Se siente ms dbil o cansada que lo habitual.  Transpira mucho.  Tiene un aumento de peso repentino, 2,5 kg o ms en una semana.  Pierde peso, 1.5 kg o ms en una semana.  Su nivel de glucosa en sangre es elevado, necesita instrucciones. SOLICITE ATENCIN MDICA DE INMEDIATO SI:  Sufre una cefalea  intensa.  Se marea o pierde el conocimiento  Presenta nuseas o vmitos.  Se siente desorientada confundida.  Sufre convulsiones.  Tiene problemas de visin.  Siente dolor en el estmago.  Presenta una hemorragia vaginal abundante.  Tiene contracciones uterinas.  Tiene una prdida importante de lquido por la vagina DESPUS QUE NACE EL BEB:  Concurra a todos los controles de seguimiento y realice los anlisis de sangre segn las indicaciones de su mdico.  Mantenga un estilo de vida saludable para evitar la diabetes en el futuro. Aqu se incluye:  Siga el plan de alimentacin saludable.  Controle su peso.  Practique actividad fsica y descanse lo necesario.  No fume.  Amamante a su beb mientras pueda. Esto disminuir la probabilidad de que usted y su beb sufran diabetes posteriormente. Para ms informacin acerca de la diabetes, visite la pgina web de la American Diabetes Association: www.americandiabetesassociation.org. Para ms informacin acerca de la diabetes gestacional cite la pgina web del American Congress of Obstetricians and Gynecologists en: www.acog.org. Document Released: 06/18/2005 Document Revised: 12/01/2011 [employer] Patient Information 2013 [employer].  

## 2012-07-23 ENCOUNTER — Ambulatory Visit (INDEPENDENT_AMBULATORY_CARE_PROVIDER_SITE_OTHER): Payer: Self-pay | Admitting: *Deleted

## 2012-07-23 VITALS — BP 116/67 | Wt 161.0 lb

## 2012-07-23 DIAGNOSIS — O24919 Unspecified diabetes mellitus in pregnancy, unspecified trimester: Secondary | ICD-10-CM

## 2012-07-23 NOTE — Progress Notes (Signed)
P-84  

## 2012-07-26 ENCOUNTER — Ambulatory Visit (INDEPENDENT_AMBULATORY_CARE_PROVIDER_SITE_OTHER): Payer: Self-pay | Admitting: Obstetrics & Gynecology

## 2012-07-26 VITALS — BP 109/71 | Temp 97.7°F | Wt 160.3 lb

## 2012-07-26 DIAGNOSIS — O09A0 Supervision of pregnancy with history of molar pregnancy, unspecified trimester: Secondary | ICD-10-CM

## 2012-07-26 DIAGNOSIS — O24919 Unspecified diabetes mellitus in pregnancy, unspecified trimester: Secondary | ICD-10-CM

## 2012-07-26 DIAGNOSIS — O0990 Supervision of high risk pregnancy, unspecified, unspecified trimester: Secondary | ICD-10-CM

## 2012-07-26 LAB — POCT URINALYSIS DIP (DEVICE)
Glucose, UA: NEGATIVE mg/dL
Nitrite: NEGATIVE
Urobilinogen, UA: 0.2 mg/dL (ref 0.0–1.0)

## 2012-07-26 LAB — OB RESULTS CONSOLE GC/CHLAMYDIA: Chlamydia: NEGATIVE

## 2012-07-26 MED ORDER — GLYBURIDE 5 MG PO TABS: 5.0000 mg | ORAL_TABLET | Freq: Two times a day (BID) | ORAL | Status: DC

## 2012-07-26 NOTE — Patient Instructions (Addendum)
Diabetes mellitus gestacional (Gestational Diabetes Mellitus) La diabetes mellitus gestacional se produce slo durante el embarazo. Aparece cuando el organismo no puede controlar adecuadamente la glucosa (azcar) que aumenta en la sangre despus de comer. Durante el embarazo, se produce una resistencia a la insulina (sensibilidad reducida a la insulina) debido a la liberacin de hormonas por parte de la placenta. Generalmente, el pncreas de una mujer embarazada produce la cantidad suficiente de insulina para vencer esa resistencia. Sin embargo, en la diabetes gestacional, hay insulina pero no cumple su funcin adecuadamente. Si la resistencia es lo suficientemente grave como para que el pncreas no produzca la cantidad de insulina suficiente, la glucosa extra se acumula en la sangre.  QUINES TIENEN RIESGO DE DESARROLLAR DIABETES GESTACIONAL?  Las mujeres con historia de diabetes en la familia.  Las mujeres de ms de 25 aos.  Las que presentan sobrepeso.  Las mujeres que pertenecen a ciertos grupos tnicos (latinas, afroamericanas, norteamericanas nativas, asiticas y las originarias de las islas del Pac[employer]. QUE PUEDE OCURRIRLE AL BEB? Si el nivel de glucosa en sangre de la madre es demasiado elevado mientras este embarazada, el nivel extra de azcar pasar por el cordn umbilical hacia el beb. Algunos de los problemas del beb pueden ser:  Beb demasiado grande: si el nio recibe demasiada azcar, puede aumentar mucho de peso. Esto puede hacer que sea demasiado grande para nacer por parto normal (vaginal) por lo que ser necesario realizar una cesrea.  Bajo nivel de glucosa (hipoglucemia): el beb produce insulina extra en respuesta a la excesiva cantidad de azcar que obtiene de la madre. Cuando el beb nace y ya no necesita insulina extra, su nivel de azcar en sangre puede disminuir.  Ictericia (coloracin amarillenta de la piel y los ojos): esto es bastante frecuente en los bebs. La  causa es la acumulacin de una sustancia qumica denominada bilirrubina. No siempre es un trastorno grave, pero se observa con frecuencia en los bebs cuyas madres sufren diabetes gestacional. RIESGOS PARA LA MADRE Las mujeres que han sufrido diabetes gestacional pueden tener ms riesgos para algunos problemas como:  Preeclampsia o toxemia, incluyendo problemas con hipertensin arterial. La presin arterial y los niveles de protenas en la orina deben controlarse con frecuencia.  Infecciones  Parto por cesrea.  Aparicin de diabetes tipo 2 en una etapa posterior de la vida. Alrededor del 30% al 50% sufrir diabetes posteriormente, especialmente las que son obesas. DIAGNSTICO Las hormonas que causan resistencia a la insulina tienen su mayor nivel alrededor de las 24 a 28 semanas del embarazo. Si se experimentan sntomas, stos son similares a los sntomas que normalmente aparecen durante el embarazo.  La diabetes mellitus gestacional generalmente se diagnostica por medio de un mtodo en dos partes: 1. Despus de la 24 a 28 semanas de embarazo, la mujer debe beber una solucin que contiene glucosa y realizar un anlisis de sangre. Si el nivel de glucosa es elevado, la realizarn un segundo anlisis. 2. La prueba oral de tolerancia a la glucosa, que dura aproximadamente tres horas. Despus de realizar ayuno durante la noche, se controla nivel de glucosa en sangre. La mujer bebe una solucin que contiene glucosa y le realizan anlisis de glucosa en sangre cada hora. Si la mujer tiene factores de riesgos para la diabetes mellitus gestacional, el mdico podr indicar el anlisis antes de las 24 semanas de embarazo. TRATAMIENTO El tratamiento est dirigido a mantener la glucosa en sangre de la madre en un nivel normal y puede incluir:  La   planificacin de los alimentos.  Recibir insulina u otro medicamento para controlar el nivel de glucosa en sangre.  La prctica de ejercicios.  Llevar un  registro diario de los alimentos que consume.  Control y registro de los niveles de glucosa en sangre.  Control de los niveles de cetona en la orina, aunque esto ya no se considera necesario en la mayora de los embarazos. INSTRUCCIONES PARA EL CUIDADO DOMICILIARIO Mientras est embarazada:  Siga los consejos de su mdico relacionados con los controles prenatales, la planificacin de la comida, la actividad fsica, los medicamentos, vitaminas, los anlisis de sangre y otras pruebas y las actividades fsicas.  Lleve un registro de las comidas, las pruebas de glucosa en sangre y la cantidad de insulina que recibe (si corresponde). Muestre todo al profesional en cada consulta mdica prenatal.  Si sufre diabetes mellitus gestacional, podr tener problemas de hipoglucemia (nivel bajo de glucosa en sangre). Podr sospechar este problema si se siente repentinamente mareada, tiene temblores y/o se siente dbil. Si cree que esto le est ocurriendo, y tiene un medidor de glucosa, mida su nivel de glucosa en sangre. Siga los consejos de su mdico sobre el modo y el momento de tratar su nivel de glucosa en sangre. Generalmente se sigue la regla 15:15 Consuma 15 g de hidratos de carbono, espere 15 minutos y vuelva controlar el nivel de glucosa en sangre.. Ejemplos de 15 g de hidratos de carbono son:  1 taza de leche descremada.   taza de jugo.  3-4 tabletas de glucosa.  5-6 caramelos duros.  1 caja pequea de pasas de uva.   taza de gaseosa comn.  Mantenga una buena higiene para evitar infecciones.  No fume. SOLICITE ATENCIN MDICA SI:  Observa prdida vaginal con o sin picazn.  Se siente ms dbil o cansada que lo habitual.  Transpira mucho.  Tiene un aumento de peso repentino, 2,5 kg o ms en una semana.  Pierde peso, 1.5 kg o ms en una semana.  Su nivel de glucosa en sangre es elevado, necesita instrucciones. SOLICITE ATENCIN MDICA DE INMEDIATO SI:  Sufre una cefalea  intensa.  Se marea o pierde el conocimiento  Presenta nuseas o vmitos.  Se siente desorientada confundida.  Sufre convulsiones.  Tiene problemas de visin.  Siente dolor en el estmago.  Presenta una hemorragia vaginal abundante.  Tiene contracciones uterinas.  Tiene una prdida importante de lquido por la vagina DESPUS QUE NACE EL BEB:  Concurra a todos los controles de seguimiento y realice los anlisis de sangre segn las indicaciones de su mdico.  Mantenga un estilo de vida saludable para evitar la diabetes en el futuro. Aqu se incluye:  Siga el plan de alimentacin saludable.  Controle su peso.  Practique actividad fsica y descanse lo necesario.  No fume.  Amamante a su beb mientras pueda. Esto disminuir la probabilidad de que usted y su beb sufran diabetes posteriormente. Para ms informacin acerca de la diabetes, visite la pgina web de la American Diabetes Association: www.americandiabetesassociation.org. Para ms informacin acerca de la diabetes gestacional cite la pgina web del American Congress of Obstetricians and Gynecologists en: www.acog.org. Document Released: 06/18/2005 Document Revised: 12/01/2011 [employer] Patient Information 2013 [employer].  

## 2012-07-26 NOTE — Progress Notes (Signed)
NST on 07/23/12 is reactive with baseline of 130 and moderate variability.

## 2012-07-26 NOTE — Progress Notes (Signed)
FBS up to  156 with non-compliance, will increase compliance and increase glyburide to 5 mg BID. NST today reactive

## 2012-07-26 NOTE — Progress Notes (Signed)
Pulse: 86 Has yellow discharge with odor, some itching.

## 2012-07-27 LAB — GC/CHLAMYDIA PROBE AMP: GC Probe RNA: NEGATIVE

## 2012-07-27 LAB — WET PREP, GENITAL: Clue Cells Wet Prep HPF POC: NONE SEEN

## 2012-07-29 ENCOUNTER — Ambulatory Visit (INDEPENDENT_AMBULATORY_CARE_PROVIDER_SITE_OTHER): Payer: Self-pay | Admitting: *Deleted

## 2012-07-29 VITALS — BP 122/63 | Temp 97.8°F | Wt 164.7 lb

## 2012-07-29 DIAGNOSIS — O99810 Abnormal glucose complicating pregnancy: Secondary | ICD-10-CM

## 2012-07-29 NOTE — Progress Notes (Signed)
P=76, Used Interpreter Charlyne Petrin

## 2012-07-29 NOTE — Progress Notes (Signed)
NST reviewed and reactive.  Manju Kulkarni L. Harraway-Smith, M.D., FACOG    

## 2012-08-02 ENCOUNTER — Ambulatory Visit (INDEPENDENT_AMBULATORY_CARE_PROVIDER_SITE_OTHER): Payer: Self-pay | Admitting: Family Medicine

## 2012-08-02 ENCOUNTER — Encounter: Payer: Self-pay | Admitting: Family Medicine

## 2012-08-02 VITALS — BP 121/73 | Temp 98.4°F | Wt 163.5 lb

## 2012-08-02 DIAGNOSIS — O99810 Abnormal glucose complicating pregnancy: Secondary | ICD-10-CM

## 2012-08-02 DIAGNOSIS — O0990 Supervision of high risk pregnancy, unspecified, unspecified trimester: Secondary | ICD-10-CM

## 2012-08-02 LAB — POCT URINALYSIS DIP (DEVICE)
Bilirubin Urine: NEGATIVE
Ketones, ur: NEGATIVE mg/dL
Specific Gravity, Urine: 1.02 (ref 1.005–1.030)
pH: 7 (ref 5.0–8.0)

## 2012-08-02 MED ORDER — GLYBURIDE 5 MG PO TABS: 5.0000 mg | ORAL_TABLET | Freq: Two times a day (BID) | ORAL | Status: DC

## 2012-08-02 NOTE — Progress Notes (Signed)
Pulse: 88

## 2012-08-02 NOTE — Progress Notes (Signed)
[id number] 2hr pp 72-205--reports BS high after dietary issues--sweetened cereals and fruits. Will increase to 7.5 mg q am and continue 5 mg q hs. NST reviewed and reactive. U/S growth at 38 wks. Sched. IOL at 39 wks

## 2012-08-03 ENCOUNTER — Telehealth (HOSPITAL_COMMUNITY): Payer: Self-pay | Admitting: *Deleted

## 2012-08-03 NOTE — Telephone Encounter (Signed)
Preadmission screen Interpreter [phone]

## 2012-08-05 ENCOUNTER — Ambulatory Visit (HOSPITAL_COMMUNITY): Payer: Self-pay | Attending: Family Medicine

## 2012-08-05 ENCOUNTER — Other Ambulatory Visit: Payer: Self-pay

## 2012-08-05 ENCOUNTER — Ambulatory Visit (HOSPITAL_COMMUNITY): Payer: Self-pay

## 2012-08-05 NOTE — Progress Notes (Signed)
NST 07-16-12 reactive  

## 2012-08-06 ENCOUNTER — Ambulatory Visit (INDEPENDENT_AMBULATORY_CARE_PROVIDER_SITE_OTHER): Payer: Self-pay | Admitting: *Deleted

## 2012-08-06 ENCOUNTER — Ambulatory Visit (HOSPITAL_COMMUNITY)
Admission: RE | Admit: 2012-08-06 | Discharge: 2012-08-06 | Disposition: A | Discharge status: Home / Self Care | Payer: Self-pay | Source: Ambulatory Visit | Attending: Obstetrics and Gynecology | Admitting: Obstetrics and Gynecology

## 2012-08-06 VITALS — BP 120/63 | Wt 164.2 lb

## 2012-08-06 DIAGNOSIS — O99810 Abnormal glucose complicating pregnancy: Secondary | ICD-10-CM | POA: Insufficient documentation

## 2012-08-06 DIAGNOSIS — O368390 Maternal care for abnormalities of the fetal heart rate or rhythm, unspecified trimester, not applicable or unspecified: Secondary | ICD-10-CM

## 2012-08-06 DIAGNOSIS — O289 Unspecified abnormal findings on antenatal screening of mother: Secondary | ICD-10-CM | POA: Insufficient documentation

## 2012-08-06 IMAGING — US US FETAL BPP W/O NONSTRESS
2 series · 12 of 28 positions shown · non-contrast
Comparison: none

[Series 1: us fetal bpp w/o nonstress · non-contrast · 54 acquisitions, 10 frames shown (1 of 2)]
[im 3/54]
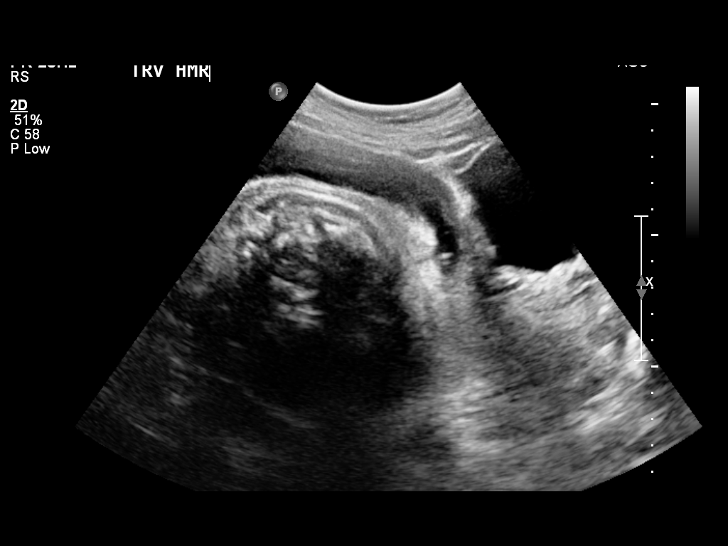
[im 7/54]
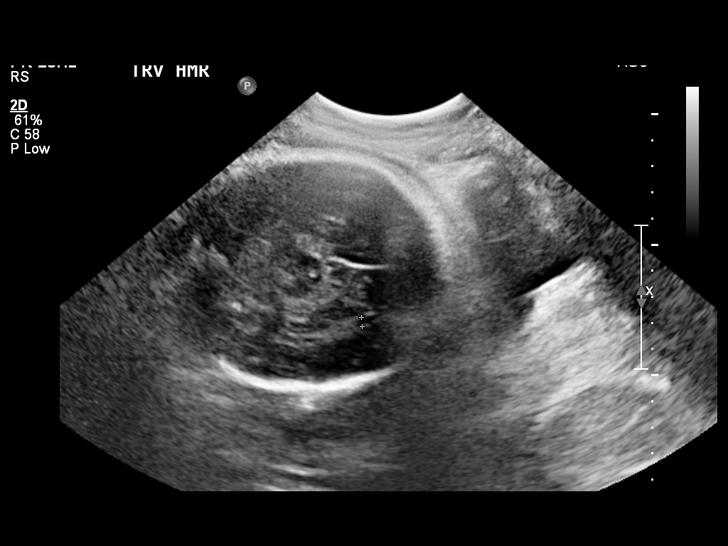
[im 12/54]
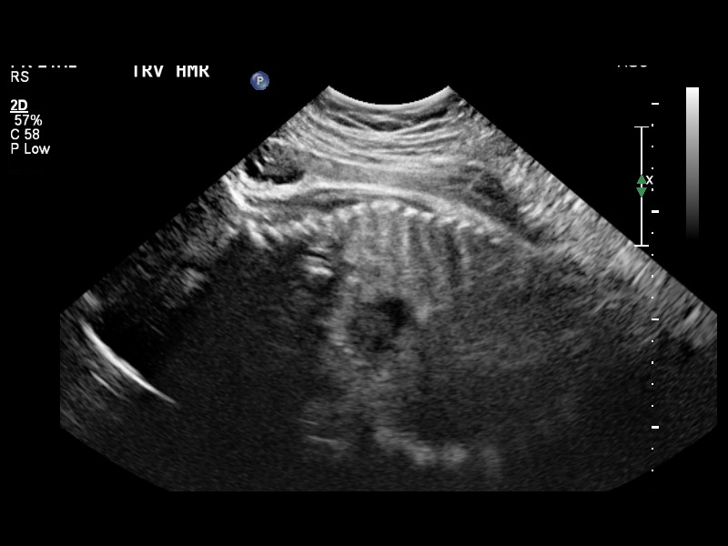
[im 19/54]
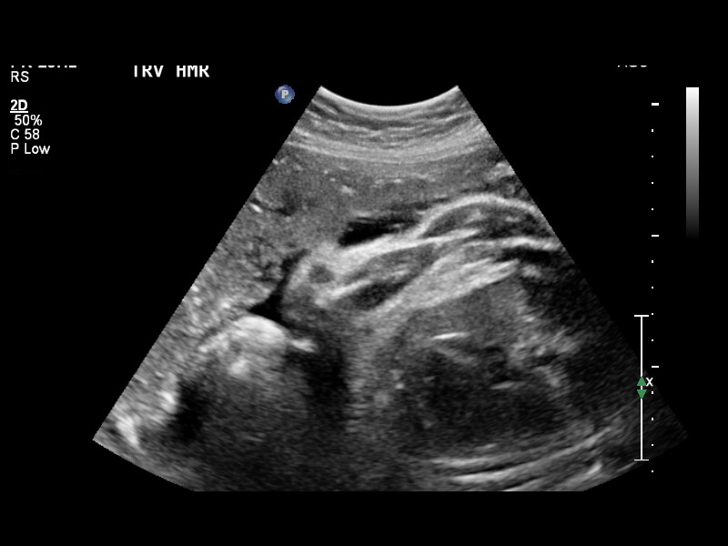
[im 24/54]
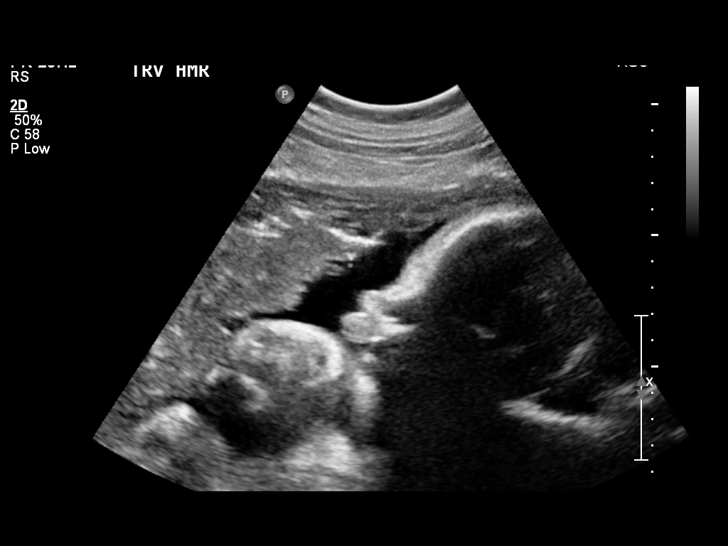
[im 28/54]
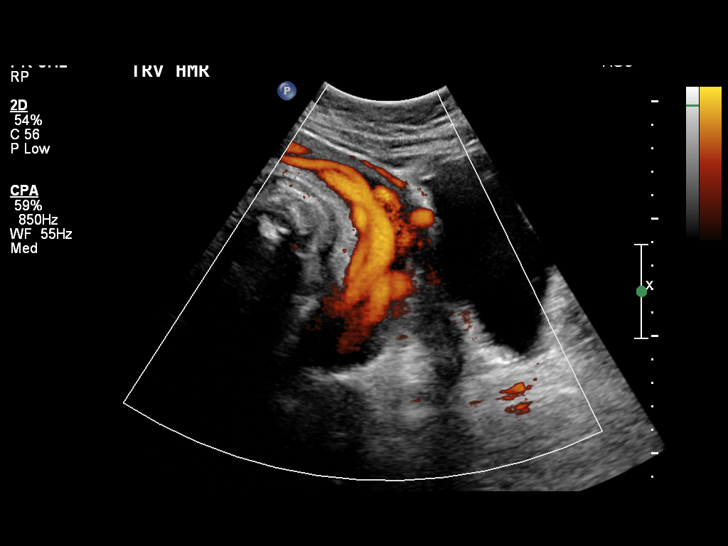
[im 35/54]
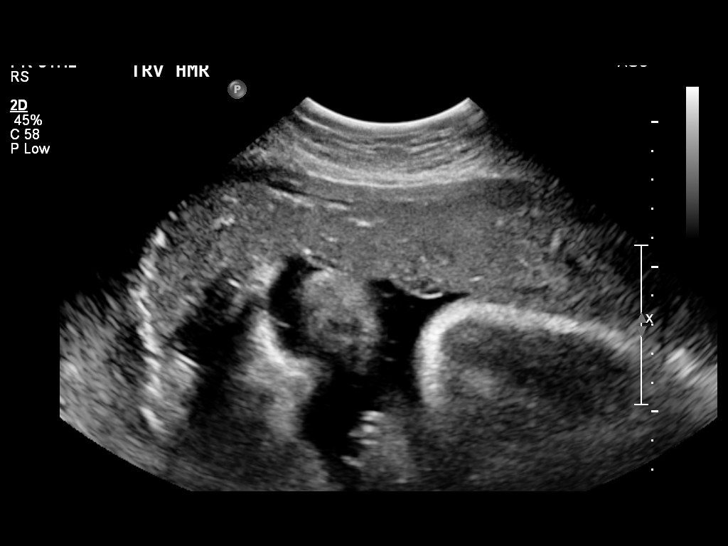
[im 40/54]
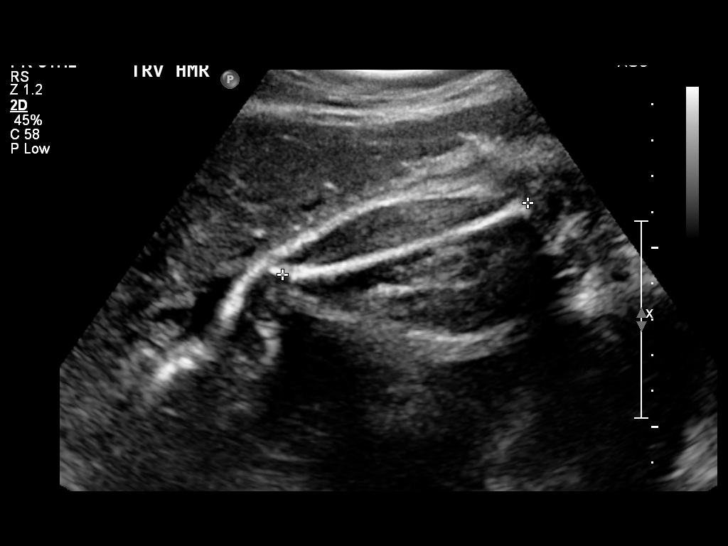
[im 44/54]
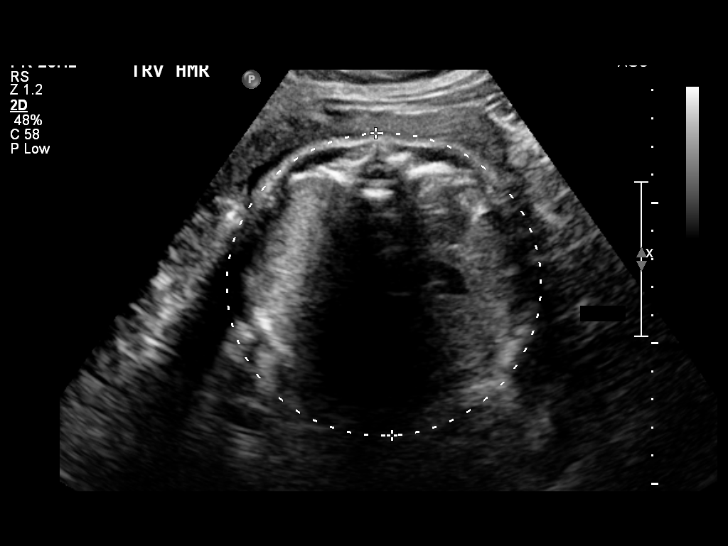
[im 51/54]
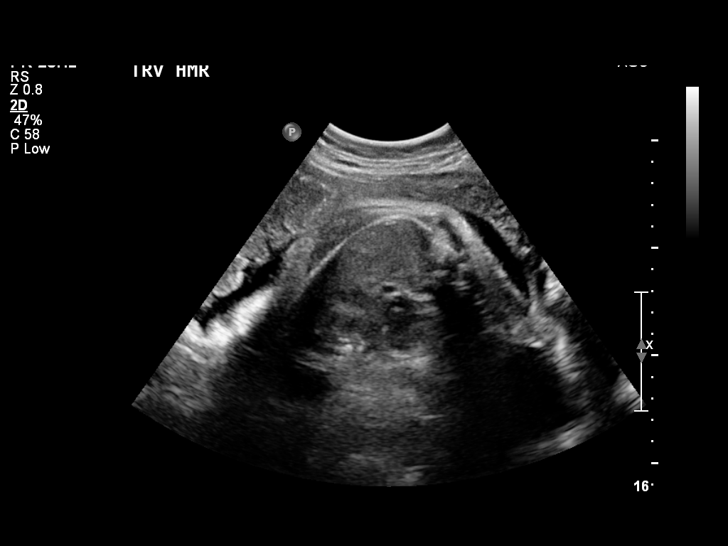

[Series 1: us fetal bpp w/o nonstress · non-contrast · 2 of 10 slices shown (2 of 2)]
[im 1/10]
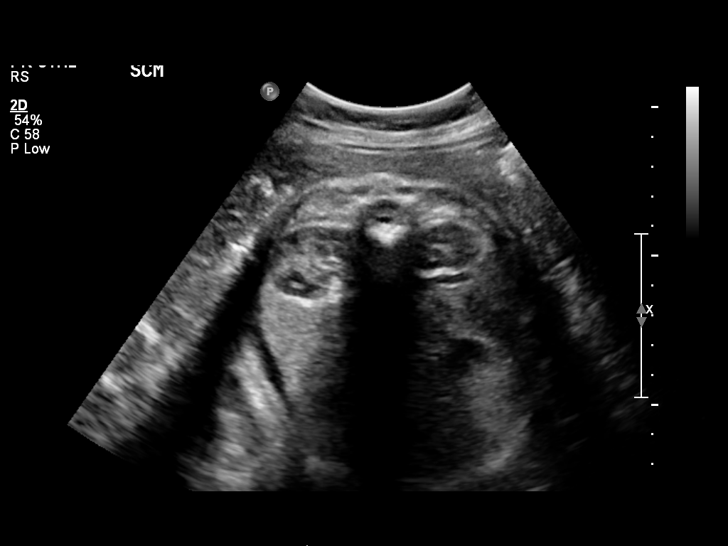
[im 7/10]
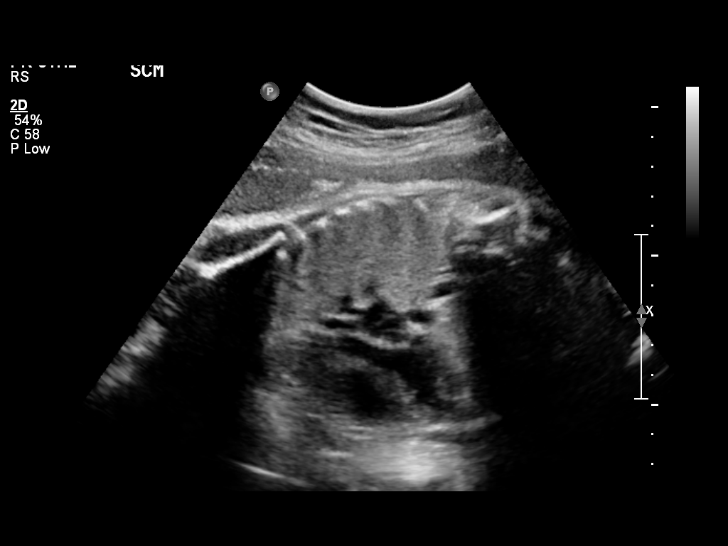

[12 of 28 positions shown; findings below may reference images not displayed]

OBSTETRICS REPORT
                      (Signed Final [DATE] [DATE])

             [REDACTED]

Service(s) Provided

 US OB FOLLOW UP                                       76816.1
Indications

 Non-reactive NST
 Diabetes - Gestational, A1 (diet controlled)
Fetal Evaluation

 Num Of Fetuses:    1
 Fetal Heart Rate:  126                         bpm
 Cardiac Activity:  Observed
 Presentation:      Transverse, head to
                    maternal right
 Placenta:          Anterior, above cervical os
 P. Cord            Previously Visualized
 Insertion:

 Comment:    BPP [DATE] in 14 minutes.

 Amniotic Fluid
 AFI FV:      Subjectively within normal limits
 AFI Sum:     13.25   cm      50   %Tile     Larg Pckt:   6.04   cm
 RUQ:   6.04   cm    RLQ:    4.5    cm    LUQ:   2.71    cm
Biophysical Evaluation

 Amniotic F.V:   Pocket => 2 cm two         F. Tone:        Observed
                 planes
 F. Movement:    Observed                   Score:          [DATE]
 F. Breathing:   Observed
Biometry

 BPD:       90  mm    G. Age:   36w 4d                CI:         69.4   70 - 86
                                                      FL/HC:      21.1   20.9 -

 HC:     344.9  mm    G. Age:   39w 6d       77  %    HC/AC:      0.99   0.92 -

 AC:     349.3  mm    G. Age:   38w 6d       87  %    FL/BPD:     80.9   71 - 87
 FL:      72.8  mm    G. Age:   37w 2d       37  %    FL/AC:      20.8   20 - 24
 HUM:     61.8  mm    G. Age:   35w 5d       38  %
 Est. FW:    [UI]  gm    7 lb 10 oz      83  %
Gestational Age

 LMP:           37w 6d       Date:   [DATE]                 EDD:   [DATE]
 U/S Today:     38w 1d                                        EDD:   [DATE]
 Best:          37w 6d    Det. By:   LMP  ([DATE])          EDD:   [DATE]
Anatomy

 Cranium:          Appears normal         Ductal Arch:      Not well visualized
 Fetal Cavum:      Appears normal         Diaphragm:        Previously seen
 Ventricles:       Appears normal         Stomach:          Appears normal, left
                                                            sided
 Choroid Plexus:   Previously seen        Abdomen:          Appears normal
 Cerebellum:       Previously seen        Abdominal Wall:   Previously seen
 Posterior Fossa:  Previously seen        Cord Vessels:     Previously seen
 Nuchal Fold:      Not applicable (>20    Kidneys:          Appear normal
                   wks GA)
 Face:             Orbits and profile     Bladder:          Appears normal
                   previously seen
 Lips:             Previously seen        Spine:            Previously seen
 Heart:            Appears normal (4      Lower             Previously seen
                   chamber & axis)        Extremities:
 RVOT:             Previously seen        Upper             Previously seen
                                          Extremities:
 LVOT:             Appears normal         Limbs:            Appears normal
                                                            (hands, ankles, feet)
 Aortic Arch:      Previously seen

 Other:  Heels, 5th digit, and Male gender previously seen. Technically difficult
         due to advanced GA and fetal position.
Targeted Anatomy

 Fetal Central Nervous System
 Lat. Ventricles:
Cervix Uterus Adnexa

 Cervix:       Not visualized (advanced GA >34 wks)
 Uterus:       No abnormality visualized.
 Cul De Sac:   No free fluid seen.

 Left Ovary:   Not visualized.
 Right Ovary:  Not visualized.
 Adnexa:     No abnormality visualized.
Impression

 Single living IUP with assigned GA of 37w 6d, fetus
 transverse, head to maternal right. EFW is [UI] gm.
 BPP is [DATE].
 Normal amniotic fluid.

## 2012-08-06 NOTE — Progress Notes (Signed)
P = 82   Accels of 10-15 bpm during NST but not reactive by criteria.  Pt sent to [place] dept for f/u growth and BPP.

## 2012-08-09 ENCOUNTER — Ambulatory Visit (INDEPENDENT_AMBULATORY_CARE_PROVIDER_SITE_OTHER): Payer: Self-pay | Admitting: Family Medicine

## 2012-08-09 ENCOUNTER — Encounter: Payer: Self-pay | Admitting: Family Medicine

## 2012-08-09 ENCOUNTER — Ambulatory Visit (HOSPITAL_COMMUNITY): Payer: Self-pay

## 2012-08-09 VITALS — BP 107/77 | Temp 97.0°F | Wt 160.0 lb

## 2012-08-09 DIAGNOSIS — O99810 Abnormal glucose complicating pregnancy: Secondary | ICD-10-CM

## 2012-08-09 LAB — POCT URINALYSIS DIP (DEVICE)
Glucose, UA: 500 mg/dL — AB
Nitrite: NEGATIVE
Protein, ur: 30 mg/dL — AB
Urobilinogen, UA: 0.2 mg/dL (ref 0.0–1.0)
pH: 6.5 (ref 5.0–8.0)

## 2012-08-09 NOTE — Patient Instructions (Signed)
Diabetes mellitus gestacional (Gestational Diabetes Mellitus) La diabetes mellitus gestacional se produce slo durante el embarazo. Aparece cuando el organismo no puede controlar adecuadamente la glucosa (azcar) que aumenta en la sangre despus de comer. Durante el embarazo, se produce una resistencia a la insulina (sensibilidad reducida a la insulina) debido a la liberacin de hormonas por parte de la placenta. Generalmente, el pncreas de una mujer embarazada produce la cantidad suficiente de insulina para vencer esa resistencia. Sin embargo, en la diabetes gestacional, hay insulina pero no cumple su funcin adecuadamente. Si la resistencia es lo suficientemente grave como para que el pncreas no produzca la cantidad de insulina suficiente, la glucosa extra se acumula en la sangre.  QUINES TIENEN RIESGO DE DESARROLLAR DIABETES GESTACIONAL?  Las mujeres con historia de diabetes en la familia.  Las mujeres de ms de 25 aos.  Las que presentan sobrepeso.  Las mujeres que pertenecen a ciertos grupos tnicos (latinas, afroamericanas, norteamericanas nativas, asiticas y las originarias de las islas del Pac[employer]. QUE PUEDE OCURRIRLE AL BEB? Si el nivel de glucosa en sangre de la madre es demasiado elevado mientras este embarazada, el nivel extra de azcar pasar por el cordn umbilical hacia el beb. Algunos de los problemas del beb pueden ser:  Beb demasiado grande: si el nio recibe demasiada azcar, puede aumentar mucho de peso. Esto puede hacer que sea demasiado grande para nacer por parto normal (vaginal) por lo que ser necesario realizar una cesrea.  Bajo nivel de glucosa (hipoglucemia): el beb produce insulina extra en respuesta a la excesiva cantidad de azcar que obtiene de la madre. Cuando el beb nace y ya no necesita insulina extra, su nivel de azcar en sangre puede disminuir.  Ictericia (coloracin amarillenta de la piel y los ojos): esto es bastante frecuente en los bebs. La  causa es la acumulacin de una sustancia qumica denominada bilirrubina. No siempre es un trastorno grave, pero se observa con frecuencia en los bebs cuyas madres sufren diabetes gestacional. RIESGOS PARA LA MADRE Las mujeres que han sufrido diabetes gestacional pueden tener ms riesgos para algunos problemas como:  Preeclampsia o toxemia, incluyendo problemas con hipertensin arterial. La presin arterial y los niveles de protenas en la orina deben controlarse con frecuencia.  Infecciones  Parto por cesrea.  Aparicin de diabetes tipo 2 en una etapa posterior de la vida. Alrededor del 30% al 50% sufrir diabetes posteriormente, especialmente las que son obesas. DIAGNSTICO Las hormonas que causan resistencia a la insulina tienen su mayor nivel alrededor de las 24 a 28 semanas del embarazo. Si se experimentan sntomas, stos son similares a los sntomas que normalmente aparecen durante el embarazo.  La diabetes mellitus gestacional generalmente se diagnostica por medio de un mtodo en dos partes: 1. Despus de la 24 a 28 semanas de embarazo, la mujer debe beber una solucin que contiene glucosa y realizar un anlisis de sangre. Si el nivel de glucosa es elevado, la realizarn un segundo anlisis. 2. La prueba oral de tolerancia a la glucosa, que dura aproximadamente tres horas. Despus de realizar ayuno durante la noche, se controla nivel de glucosa en sangre. La mujer bebe una solucin que contiene glucosa y le realizan anlisis de glucosa en sangre cada hora. Si la mujer tiene factores de riesgos para la diabetes mellitus gestacional, el mdico podr indicar el anlisis antes de las 24 semanas de embarazo. TRATAMIENTO El tratamiento est dirigido a mantener la glucosa en sangre de la madre en un nivel normal y puede incluir:  La   planificacin de los alimentos.  Recibir insulina u otro medicamento para controlar el nivel de glucosa en sangre.  La prctica de ejercicios.  Llevar un  registro diario de los alimentos que consume.  Control y registro de los niveles de glucosa en sangre.  Control de los niveles de cetona en la orina, aunque esto ya no se considera necesario en la mayora de los embarazos. INSTRUCCIONES PARA EL CUIDADO DOMICILIARIO Mientras est embarazada:  Siga los consejos de su mdico relacionados con los controles prenatales, la planificacin de la comida, la actividad fsica, los medicamentos, vitaminas, los anlisis de sangre y otras pruebas y las actividades fsicas.  Lleve un registro de las comidas, las pruebas de glucosa en sangre y la cantidad de insulina que recibe (si corresponde). Muestre todo al profesional en cada consulta mdica prenatal.  Si sufre diabetes mellitus gestacional, podr tener problemas de hipoglucemia (nivel bajo de glucosa en sangre). Podr sospechar este problema si se siente repentinamente mareada, tiene temblores y/o se siente dbil. Si cree que esto le est ocurriendo, y tiene un medidor de glucosa, mida su nivel de glucosa en sangre. Siga los consejos de su mdico sobre el modo y el momento de tratar su nivel de glucosa en sangre. Generalmente se sigue la regla 15:15 Consuma 15 g de hidratos de carbono, espere 15 minutos y vuelva controlar el nivel de glucosa en sangre.. Ejemplos de 15 g de hidratos de carbono son:  1 taza de leche descremada.   taza de jugo.  3-4 tabletas de glucosa.  5-6 caramelos duros.  1 caja pequea de pasas de uva.   taza de gaseosa comn.  Mantenga una buena higiene para evitar infecciones.  No fume. SOLICITE ATENCIN MDICA SI:  Observa prdida vaginal con o sin picazn.  Se siente ms dbil o cansada que lo habitual.  Transpira mucho.  Tiene un aumento de peso repentino, 2,5 kg o ms en una semana.  Pierde peso, 1.5 kg o ms en una semana.  Su nivel de glucosa en sangre es elevado, necesita instrucciones. SOLICITE ATENCIN MDICA DE INMEDIATO SI:  Sufre una cefalea  intensa.  Se marea o pierde el conocimiento  Presenta nuseas o vmitos.  Se siente desorientada confundida.  Sufre convulsiones.  Tiene problemas de visin.  Siente dolor en el estmago.  Presenta una hemorragia vaginal abundante.  Tiene contracciones uterinas.  Tiene una prdida importante de lquido por la vagina DESPUS QUE NACE EL BEB:  Concurra a todos los controles de seguimiento y realice los anlisis de sangre segn las indicaciones de su mdico.  Mantenga un estilo de vida saludable para evitar la diabetes en el futuro. Aqu se incluye:  Siga el plan de alimentacin saludable.  Controle su peso.  Practique actividad fsica y descanse lo necesario.  No fume.  Amamante a su beb mientras pueda. Esto disminuir la probabilidad de que usted y su beb sufran diabetes posteriormente. Para ms informacin acerca de la diabetes, visite la pgina web de la American Diabetes Association: www.americandiabetesassociation.org. Para ms informacin acerca de la diabetes gestacional cite la pgina web del American Congress of Obstetricians and Gynecologists en: www.acog.org. Document Released: 06/18/2005 Document Revised: 12/01/2011 [employer] Patient Information 2013 [employer].  

## 2012-08-09 NOTE — Progress Notes (Signed)
U/S shows 7lb 10 oz last wk, nml fluid, vtx Needs IOL No book today NST reviewed and reactive.  Seen with Diane Day, pt. Is scheduled for IOL on Sat. @ 39 wks.

## 2012-08-09 NOTE — Progress Notes (Signed)
[place] growth/BPP done 08/06/12.

## 2012-08-09 NOTE — Progress Notes (Signed)
Pulse 86. C/o paresthesia in hands and feet. C/o pressure in pelvic; no pain.

## 2012-08-11 NOTE — Progress Notes (Signed)
11/15 NST reviewed and category II. Patient sent to radiology for BPP which was 8/8

## 2012-08-12 ENCOUNTER — Ambulatory Visit (INDEPENDENT_AMBULATORY_CARE_PROVIDER_SITE_OTHER): Payer: Self-pay | Admitting: General Practice

## 2012-08-12 VITALS — BP 123/79 | Wt 165.9 lb

## 2012-08-12 DIAGNOSIS — O99810 Abnormal glucose complicating pregnancy: Secondary | ICD-10-CM

## 2012-08-12 NOTE — Progress Notes (Signed)
Pulse-  95 

## 2012-08-14 ENCOUNTER — Encounter (HOSPITAL_COMMUNITY): Payer: Self-pay

## 2012-08-14 ENCOUNTER — Inpatient Hospital Stay (HOSPITAL_COMMUNITY)
Admission: RE | Admit: 2012-08-14 | Discharge: 2012-08-16 | DRG: 775 | Disposition: A | Discharge status: Home / Self Care | Payer: Medicaid Other | Source: Ambulatory Visit | Attending: Obstetrics & Gynecology | Admitting: Obstetrics & Gynecology

## 2012-08-14 VITALS — BP 111/69 | HR 85 | Temp 97.5°F | Resp 18 | Ht 60.0 in | Wt 164.0 lb

## 2012-08-14 DIAGNOSIS — O0990 Supervision of high risk pregnancy, unspecified, unspecified trimester: Secondary | ICD-10-CM

## 2012-08-14 DIAGNOSIS — O99814 Abnormal glucose complicating childbirth: Principal | ICD-10-CM | POA: Diagnosis present

## 2012-08-14 DIAGNOSIS — O09A0 Supervision of pregnancy with history of molar pregnancy, unspecified trimester: Secondary | ICD-10-CM

## 2012-08-14 DIAGNOSIS — O99810 Abnormal glucose complicating pregnancy: Secondary | ICD-10-CM

## 2012-08-14 LAB — CBC
HCT: 34.1 % — ABNORMAL LOW (ref 36.0–46.0)
MCH: 25.4 pg — ABNORMAL LOW (ref 26.0–34.0)
MCHC: 31.7 g/dL (ref 30.0–36.0)
MCV: 80 fL (ref 78.0–100.0)
RDW: 15.2 % (ref 11.5–15.5)

## 2012-08-14 MED ORDER — LACTATED RINGERS IV SOLN
INTRAVENOUS | Status: DC
  Administered 2012-08-14 – 2012-08-15 (×2): via INTRAVENOUS

## 2012-08-14 MED ORDER — OXYCODONE-ACETAMINOPHEN 5-325 MG PO TABS: 1.0000 | ORAL_TABLET | ORAL | Status: DC | PRN

## 2012-08-14 MED ORDER — LACTATED RINGERS IV SOLN
500.0000 mL | INTRAVENOUS | Status: DC | PRN
  Administered 2012-08-15: 500 mL via INTRAVENOUS

## 2012-08-14 MED ORDER — NALBUPHINE HCL 10 MG/ML IJ SOLN: 5.0000 mg | INTRAMUSCULAR | Status: DC | PRN

## 2012-08-14 MED ORDER — CITRIC ACID-SODIUM CITRATE 334-500 MG/5ML PO SOLN: 30.0000 mL | ORAL | Status: DC | PRN

## 2012-08-14 MED ORDER — ACETAMINOPHEN 325 MG PO TABS: 650.0000 mg | ORAL_TABLET | ORAL | Status: DC | PRN

## 2012-08-14 MED ORDER — TERBUTALINE SULFATE 1 MG/ML IJ SOLN: 0.2500 mg | Freq: Once | INTRAMUSCULAR | Status: AC | PRN

## 2012-08-14 MED ORDER — ONDANSETRON HCL 4 MG/2ML IJ SOLN: 4.0000 mg | Freq: Four times a day (QID) | INTRAMUSCULAR | Status: DC | PRN

## 2012-08-14 MED ORDER — LIDOCAINE HCL (PF) 1 % IJ SOLN
30.0000 mL | INTRAMUSCULAR | Status: DC | PRN
  Filled 2012-08-14: qty 30

## 2012-08-14 MED ORDER — OXYTOCIN 40 UNITS IN LACTATED RINGERS INFUSION - SIMPLE MED: 62.5000 mL/h | INTRAVENOUS | Status: DC

## 2012-08-14 MED ORDER — OXYTOCIN 40 UNITS IN LACTATED RINGERS INFUSION - SIMPLE MED
1.0000 m[IU]/min | INTRAVENOUS | Status: DC
  Administered 2012-08-14: 2 m[IU]/min via INTRAVENOUS
  Filled 2012-08-14: qty 1000

## 2012-08-14 MED ORDER — FLEET ENEMA 7-19 GM/118ML RE ENEM: 1.0000 | ENEMA | RECTAL | Status: DC | PRN

## 2012-08-14 MED ORDER — IBUPROFEN 600 MG PO TABS: 600.0000 mg | ORAL_TABLET | Freq: Four times a day (QID) | ORAL | Status: DC | PRN

## 2012-08-14 MED ORDER — OXYTOCIN BOLUS FROM INFUSION: 500.0000 mL | INTRAVENOUS | Status: DC

## 2012-08-14 NOTE — Progress Notes (Signed)
Rebecca Ayala is a 31 y.o. [phone] at [redacted]w[redacted]d admitted for induction of labor due to Gestational diabetes.  Subjective: States foley bulb fell out when she got up to void.  No complaints.   Objective: BP 119/79  Pulse 78  Temp 97.7 F (36.5 C) (Oral)  Resp 18  Ht 5' (1.524 m)  Wt 74.39 kg (164 lb)  BMI 32.03 kg/m2  LMP 11/15/2011     RN hadn't had time to start pitocin yet before foley bulb fell out FHT:  FHR: 135 bpm, variability: moderate,  accelerations:  Present,  decelerations:  Absent UC:   irregular, every 1.5-6 minutes SVE:   Dilation: 5 Effacement (%): 50 Station: -2 Exam by:: k.Aaryn Sermon cnm vtx, posterior Labs: Lab Results  Component Value Date   WBC 10.3 08/14/2012   HGB 10.8* 08/14/2012   HCT 34.1* 08/14/2012   MCV 80.0 08/14/2012   PLT 273 08/14/2012    Assessment / Plan: IOL d/t A2DM, s/p foley bulb, cervical ripening phase complete.  RN to initiate pitocin per protocol to acheive adequate labor.  Labor: Progressing normally Preeclampsia:  n/a Fetal Wellbeing:  Category I Pain Control:  n/a I/D:  n/a Anticipated MOD:  NSVD  [name] 08/14/2012, 10:13 PM

## 2012-08-14 NOTE — H&P (Signed)
Rebecca Ayala is a 31 y.o. [phone] female at [redacted]w[redacted]d by LMP which correlates w/in 4 days w/ 2nd trimester u/s, presenting for IOL d/t A2DM.  Currently on glyburide 7.5mg  q am and 5mg  q hs.  States cbg's have been 'good and bad' lately, highest 2hr pp 212, highest fasting 115.  Regular pnc at [facility] begun @ 22.4wks.  Early glucola 123. Subsequent 1hr glucola 166, w/ 3hr results 84, 196, 179, 157. Too late for genetic screening.  Anatomy u/s wnl.  U/S on 11/15 @ 37.6wks: baby transverse, bpp 8/8, AFI wnl, EFW 3453gm 83%. H/O SVD x 3, pelvis proven to 8lb 15oz.  Molar pregnancy in 2012 w/ note in prenatal for concern for choriocarcinoma pp.   Reports good fm.  Denies vb, lof.  Having some mild uc's.  Denies ha, scotomata, ruq/epigastric pain, n/v.   History OB History    Grav Para Term Preterm Abortions TAB SAB Ect Mult Living   7 3 3  0 3 0 3 0 0 3     Past Medical History  Diagnosis Date  . Headache   . Hyperlipidemia   . Gastritis    Past Surgical History  Procedure Date  . Dilation and curettage of uterus    Family History: family history is not on file. Social History:  reports that she has never smoked. She does not have any smokeless tobacco history on file. She reports that she does not drink alcohol or use illicit drugs.   Prenatal Transfer Tool  Maternal Diabetes: Yes:  Diabetes Type:  Insulin/Medication controlled A2DM Genetic Screening: Declined Maternal Ultrasounds/Referrals: Normal Fetal Ultrasounds or other Referrals:  None Maternal Substance Abuse:  No Significant Maternal Medications:  Meds include: Other: glyburide Significant Maternal Lab Results:  None Other Comments:  None  Review of Systems  Constitutional: Negative.   HENT: Negative.   Eyes: Positive for blurred vision (since dx w/ GDM).  Respiratory: Negative.   Cardiovascular: Negative.   Gastrointestinal: Negative.   Genitourinary: Negative.   Musculoskeletal: Negative.   Skin: Negative.     Neurological: Negative.   Endo/Heme/Allergies: Negative.   Psychiatric/Behavioral: Negative.     Dilation: 1.5 Effacement (%): 50 Station: -2 Exam by:: k.booker cnm Blood pressure 122/75, pulse 83, temperature 97.7 F (36.5 C), temperature source Oral, resp. rate 18, height 5' (1.524 m), weight 74.39 kg (164 lb), last menstrual period 11/15/2011.  Random CBG on admission: 80  Maternal Exam:  Uterine Assessment: Contraction strength is mild.  Contraction frequency is irregular.   Abdomen: Patient reports no abdominal tenderness. Introitus: Normal vulva. Normal vagina.  Pelvis: adequate for delivery.   Cervix: Cervix evaluated by digital exam.     Fetal Exam Fetal Monitor Review: Mode: ultrasound.   Baseline rate: 130.  Variability: moderate (6-25 bpm).   Pattern: accelerations present and no decelerations.    Fetal State Assessment: Category I - tracings are normal.     Physical Exam  Constitutional: She is oriented to person, place, and time. She appears well-developed and well-nourished.  HENT:  Head: Normocephalic.  Neck: Normal range of motion.  Cardiovascular: Normal rate and regular rhythm.   Respiratory: Effort normal and breath sounds normal.  GI: Soft.       gravid  Genitourinary: Vagina normal and uterus normal.       SVE: 1.5/50/-2, posterior, vtx  Musculoskeletal: Normal range of motion.  Neurological: She is alert and oriented to person, place, and time. She has normal reflexes.  Skin: Skin is warm and  dry.  Psychiatric: She has a normal mood and affect. Her behavior is normal. Judgment and thought content normal.    Cervical foley bulb inserted and inflated w/ 60ml LR w/o difficulty   Prenatal labs: ABO, Rh: O/POS/-- (07/31 0935) Antibody: NEG (07/31 0935) Rubella: 178.7 (07/31 0935) RPR: NON REAC (09/05 1213)  HBsAg: NEGATIVE (07/31 0935)  HIV: NON REACTIVE (09/05 1213)  GBS: Negative (11/04 0000)  1hr gtt: 166 3hr: 84, 196, 179,  157  Assessment/Plan: A:   [redacted]w[redacted]d SIUP  IOL d/t A2DM- glyburide  EFW @ 37.6wks 7lb 10oz, 83%  Cat I FHR  GBS neg  P:   Admitted to BS  Cervical foley bulb in place  Initiate pitocin per protocol to max of 66mu/min while foley bulb in. When foley bulb out, increase per protocol to achieve adequate labor  May have iv pain meds prn  Epidural at maternal request in active labor  CBGs q 4hours during cervical ripening phase, then q 2hours during active labor  Glucostabilizer if indicated by cbg's  Anticipate NSVD     [name] 08/14/2012, 9:42 PM

## 2012-08-15 ENCOUNTER — Inpatient Hospital Stay (HOSPITAL_COMMUNITY): Payer: Medicaid Other | Admitting: Anesthesiology

## 2012-08-15 ENCOUNTER — Encounter (HOSPITAL_COMMUNITY): Payer: Self-pay

## 2012-08-15 ENCOUNTER — Encounter (HOSPITAL_COMMUNITY): Payer: Self-pay | Admitting: Anesthesiology

## 2012-08-15 DIAGNOSIS — O99814 Abnormal glucose complicating childbirth: Secondary | ICD-10-CM

## 2012-08-15 LAB — TYPE AND SCREEN
ABO/RH(D): O POS
Antibody Screen: NEGATIVE

## 2012-08-15 LAB — GLUCOSE, CAPILLARY: Glucose-Capillary: 91 mg/dL (ref 70–99)

## 2012-08-15 MED ORDER — ONDANSETRON HCL 4 MG/2ML IJ SOLN: 4.0000 mg | INTRAMUSCULAR | Status: DC | PRN

## 2012-08-15 MED ORDER — IBUPROFEN 600 MG PO TABS
600.0000 mg | ORAL_TABLET | Freq: Four times a day (QID) | ORAL | Status: DC
  Administered 2012-08-15 – 2012-08-16 (×4): 600 mg via ORAL
  Filled 2012-08-15 (×4): qty 1

## 2012-08-15 MED ORDER — SENNOSIDES-DOCUSATE SODIUM 8.6-50 MG PO TABS
2.0000 | ORAL_TABLET | Freq: Every day | ORAL | Status: DC
  Administered 2012-08-15: 2 via ORAL

## 2012-08-15 MED ORDER — TETANUS-DIPHTH-ACELL PERTUSSIS 5-2.5-18.5 LF-MCG/0.5 IM SUSP: 0.5000 mL | Freq: Once | INTRAMUSCULAR | Status: DC

## 2012-08-15 MED ORDER — NALBUPHINE SYRINGE 5 MG/0.5 ML
5.0000 mg | INJECTION | INTRAMUSCULAR | Status: DC | PRN
  Filled 2012-08-15: qty 0.5

## 2012-08-15 MED ORDER — FENTANYL 2.5 MCG/ML BUPIVACAINE 1/10 % EPIDURAL INFUSION (WH - ANES)
INTRAMUSCULAR | Status: AC | PRN
  Administered 2012-08-15 (×2): 12 mL/h via EPIDURAL

## 2012-08-15 MED ORDER — PHENYLEPHRINE 40 MCG/ML (10ML) SYRINGE FOR IV PUSH (FOR BLOOD PRESSURE SUPPORT)
80.0000 ug | PREFILLED_SYRINGE | INTRAVENOUS | Status: DC | PRN
  Filled 2012-08-15: qty 5

## 2012-08-15 MED ORDER — FENTANYL CITRATE 0.05 MG/ML IJ SOLN
50.0000 ug | INTRAMUSCULAR | Status: DC | PRN
  Administered 2012-08-15: 08:00:00 via INTRAVENOUS
  Filled 2012-08-15 (×2): qty 2

## 2012-08-15 MED ORDER — DIPHENHYDRAMINE HCL 25 MG PO CAPS: 25.0000 mg | ORAL_CAPSULE | Freq: Four times a day (QID) | ORAL | Status: DC | PRN

## 2012-08-15 MED ORDER — EPHEDRINE 5 MG/ML INJ
10.0000 mg | INTRAVENOUS | Status: DC | PRN
  Filled 2012-08-15: qty 4

## 2012-08-15 MED ORDER — PHENYLEPHRINE 40 MCG/ML (10ML) SYRINGE FOR IV PUSH (FOR BLOOD PRESSURE SUPPORT): 80.0000 ug | PREFILLED_SYRINGE | INTRAVENOUS | Status: DC | PRN

## 2012-08-15 MED ORDER — PRENATAL MULTIVITAMIN CH
1.0000 | ORAL_TABLET | Freq: Every day | ORAL | Status: DC
  Administered 2012-08-16: 1 via ORAL
  Filled 2012-08-15: qty 1

## 2012-08-15 MED ORDER — OXYCODONE-ACETAMINOPHEN 5-325 MG PO TABS: 1.0000 | ORAL_TABLET | ORAL | Status: DC | PRN

## 2012-08-15 MED ORDER — ONDANSETRON HCL 4 MG PO TABS: 4.0000 mg | ORAL_TABLET | ORAL | Status: DC | PRN

## 2012-08-15 MED ORDER — EPHEDRINE 5 MG/ML INJ: 10.0000 mg | INTRAVENOUS | Status: DC | PRN

## 2012-08-15 MED ORDER — ZOLPIDEM TARTRATE 5 MG PO TABS: 5.0000 mg | ORAL_TABLET | Freq: Every evening | ORAL | Status: DC | PRN

## 2012-08-15 MED ORDER — DIBUCAINE 1 % RE OINT: 1.0000 "application " | TOPICAL_OINTMENT | RECTAL | Status: DC | PRN

## 2012-08-15 MED ORDER — FENTANYL 2.5 MCG/ML BUPIVACAINE 1/10 % EPIDURAL INFUSION (WH - ANES)
14.0000 mL/h | INTRAMUSCULAR | Status: DC
  Filled 2012-08-15: qty 125

## 2012-08-15 MED ORDER — LACTATED RINGERS IV SOLN: 500.0000 mL | Freq: Once | INTRAVENOUS | Status: DC

## 2012-08-15 MED ORDER — SODIUM BICARBONATE 8.4 % IV SOLN
INTRAVENOUS | Status: AC | PRN
  Administered 2012-08-15: 5 mL via EPIDURAL

## 2012-08-15 MED ORDER — DIPHENHYDRAMINE HCL 50 MG/ML IJ SOLN: 12.5000 mg | INTRAMUSCULAR | Status: DC | PRN

## 2012-08-15 MED ORDER — LANOLIN HYDROUS EX OINT: TOPICAL_OINTMENT | CUTANEOUS | Status: DC | PRN

## 2012-08-15 MED ORDER — WITCH HAZEL-GLYCERIN EX PADS: 1.0000 "application " | MEDICATED_PAD | CUTANEOUS | Status: DC | PRN

## 2012-08-15 MED ORDER — BENZOCAINE-MENTHOL 20-0.5 % EX AERO: 1.0000 "application " | INHALATION_SPRAY | CUTANEOUS | Status: DC | PRN

## 2012-08-15 MED ORDER — SIMETHICONE 80 MG PO CHEW: 80.0000 mg | CHEWABLE_TABLET | ORAL | Status: DC | PRN

## 2012-08-15 NOTE — Progress Notes (Signed)
Rebecca Ayala is a 31 y.o. [phone] at [redacted]w[redacted]d  admitted for induction of labor due to Gestational diabetes.  Subjective:   Objective: BP 119/65  Pulse 94  Temp 97.7 F (36.5 C) (Oral)  Resp 20  Ht 5' (1.524 m)  Wt 74.39 kg (164 lb)  BMI 32.03 kg/m2  SpO2 98%  LMP 11/15/2011      FHT:  FHR: 145 bpm, variability: moderate,  accelerations:  Present,  decelerations:  Absent UC:   regular, every 2-3 minutes SVE:   Dilation: 7.5 Effacement (%): 80 Station: -1 Exam by: Dr. Aviva Signs  Labs: Lab Results  Component Value Date   WBC 10.3 08/14/2012   HGB 10.8* 08/14/2012   HCT 34.1* 08/14/2012   MCV 80.0 08/14/2012   PLT 273 08/14/2012    Assessment / Plan: Induction of labor due to gestational diabetes,  progressing well on pitocin  Labor: Progressing on Pitocin, will continue to increase then AROM Fetal Wellbeing:  Category II Pain Control:  Epidural I/D:  n/a Anticipated MOD:  NSVD  PILOTO, Aasiyah Auerbach 08/15/2012, 9:16 AM

## 2012-08-15 NOTE — Progress Notes (Signed)
cbg didn't transfer to chart. cbg 79 at 2310

## 2012-08-15 NOTE — Progress Notes (Addendum)
Rebecca Ayala is a 31 y.o. [phone] at [redacted]w[redacted]d admitted for induction of labor due to Gestational diabetes.  Subjective: Comfortable, uc's about the same, no complaints. Family supportive at bs.   Objective: BP 137/80  Pulse 84  Temp 97.5 F (36.4 C) (Oral)  Resp 18  Ht 5' (1.524 m)  Wt 74.39 kg (164 lb)  BMI 32.03 kg/m2  LMP 11/15/2011      FHT:  FHR: 130 bpm, variability: moderate,  accelerations:  Present,  decelerations:  Absent UC:   regular, every 2-3 minutes SVE:   Dilation: 5 Effacement (%): 50 Station: -2 Exam by:: k. Joaopedro Eschbach cnm BBOW @ 0530 AROM large amount clear fluid  Labs: Lab Results  Component Value Date   WBC 10.3 08/14/2012   HGB 10.8* 08/14/2012   HCT 34.1* 08/14/2012   MCV 80.0 08/14/2012   PLT 273 08/14/2012    Assessment / Plan: IOL d/t A2DM, currently on 69mu/min pitocin, now arom'd- keep pitocin at current dose for now  Labor: Early labor Preeclampsia:  n/a Fetal Wellbeing:  Category I Pain Control:  Labor support without medications I/D:  n/a Anticipated MOD:  NSVD  Rebecca Ayala 08/15/2012, 5:59 AM

## 2012-08-15 NOTE — Anesthesia Preprocedure Evaluation (Signed)
Anesthesia Evaluation  Patient identified by MRN, date of birth, ID band Patient awake    Reviewed: Allergy & Precautions, H&P , Patient's Chart, lab work & pertinent test results  Airway Mallampati: II TM Distance: >3 FB Neck ROM: full    Dental  (+) Teeth Intact   Pulmonary  breath sounds clear to auscultation        Cardiovascular Rhythm:regular Rate:Normal     Neuro/Psych    GI/Hepatic   Endo/Other  Gestational  Renal/GU      Musculoskeletal   Abdominal   Peds  Hematology   Anesthesia Other Findings       Reproductive/Obstetrics (+) Pregnancy                           Anesthesia Physical Anesthesia Plan  ASA: III  Anesthesia Plan: Epidural   Post-op Pain Management:    Induction:   Airway Management Planned:   Additional Equipment:   Intra-op Plan:   Post-operative Plan:   Informed Consent: I have reviewed the patients History and Physical, chart, labs and discussed the procedure including the risks, benefits and alternatives for the proposed anesthesia with the patient or authorized representative who has indicated his/her understanding and acceptance.   Dental Advisory Given  Plan Discussed with:   Anesthesia Plan Comments: (Labs checked- platelets confirmed with RN in room. Fetal heart tracing, per RN, reported to be stable enough for sitting procedure. Discussed epidural, and patient consents to the procedure:  included risk of possible headache,backache, failed block, allergic reaction, and nerve injury. This patient was asked if she had any questions or concerns before the procedure started. )        Anesthesia Quick Evaluation

## 2012-08-15 NOTE — Progress Notes (Signed)
Rebecca Ayala is a 31 y.o. [phone] at [redacted]w[redacted]d admitted for induction of labor due to Gestational diabetes.  Subjective: Slightly uncomfortable w/ uc's, doing well.  Family supportive at bs.   Objective: BP 120/76  Pulse 88  Temp 98 F (36.7 C) (Oral)  Resp 18  Ht 5' (1.524 m)  Wt 74.39 kg (164 lb)  BMI 32.03 kg/m2  LMP 11/15/2011      FHT:  FHR: 125 bpm, variability: moderate,  accelerations:  Present,  decelerations:  Absent UC:   regular, every 2-3 minutes SVE:   Dilation: 5 Effacement (%): 50 Station: -2 Exam by:: k. booker cnm  Labs: Lab Results  Component Value Date   WBC 10.3 08/14/2012   HGB 10.8* 08/14/2012   HCT 34.1* 08/14/2012   MCV 80.0 08/14/2012   PLT 273 08/14/2012    Assessment / Plan: IOL d/t A2DM, s/p cervical ripening phase, pitocin @ 61mu/min, RN to continue increasing to acheive adequate labor  Labor: not yet Preeclampsia:  n/a Fetal Wellbeing:  Category I Pain Control:  Labor support without medications I/D:  n/a Anticipated MOD:  NSVD  Rebecca Ayala 08/15/2012, 2:22 AM

## 2012-08-15 NOTE — Anesthesia Procedure Notes (Signed)

## 2012-08-16 LAB — GLUCOSE, CAPILLARY: Glucose-Capillary: 79 mg/dL (ref 70–99)

## 2012-08-16 MED ORDER — IBUPROFEN 600 MG PO TABS: 600.0000 mg | ORAL_TABLET | Freq: Four times a day (QID) | ORAL | Status: DC

## 2012-08-16 NOTE — Anesthesia Postprocedure Evaluation (Signed)
  Anesthesia Post-op Note  Patient: Rebecca Ayala  Procedure(s) Performed: * No procedures listed *  Patient Location: PACU and Mother/Baby  Anesthesia Type:Epidural  Level of Consciousness: awake, alert  and oriented  Airway and Oxygen Therapy: Patient Spontanous Breathing  Post-op Pain: none  Post-op Assessment: Post-op Vital signs reviewed  Post-op Vital Signs: Reviewed and stable  Complications: No apparent anesthesia complications

## 2012-08-16 NOTE — Discharge Summary (Addendum)
Obstetric Discharge Summary Reason for Admission: onset of labor Prenatal Procedures: ultrasound Intrapartum Procedures: spontaneous vaginal delivery Postpartum Procedures: none Complications-Operative and Postpartum: none Hemoglobin  Date Value Range Status  08/14/2012 10.8* 12.0 - 15.0 g/dL Final     HCT  Date Value Range Status  08/14/2012 34.1* 36.0 - 46.0 % Final    Physical Exam:  General: alert, cooperative and no distress Lochia: appropriate Uterine Fundus: firm DVT Evaluation: No evidence of DVT seen on physical exam.  Discharge Diagnoses: Term Pregnancy-delivered  Discharge Information: Date: 08/16/2012 Activity: unrestricted Diet: routine Medications: Ibuprofen Condition: stable Instructions: refer to practice specific booklet Discharge to: home Follow-up Information    Follow up with [facility]. Schedule an appointment as soon as possible for a visit in 6 weeks.   Contact information:   [address] [place] [address] [phone]         Newborn Data: Live born female  Birth Weight: 7 lb 12.5 oz (3530 g) APGAR: 9, 9  Home with mother.  PILOTO, DAYARMYS 08/16/2012, 7:14 AM

## 2012-08-16 NOTE — Discharge Summary (Signed)
I have seen the patient with the resident/student and agree with the above.   

## 2012-08-16 NOTE — Progress Notes (Signed)
UR chart review completed.  

## 2012-08-26 NOTE — Progress Notes (Signed)
NST reactive on 08/12/12 

## 2012-09-13 ENCOUNTER — Encounter: Payer: Self-pay | Admitting: Obstetrics & Gynecology

## 2012-09-13 ENCOUNTER — Ambulatory Visit (INDEPENDENT_AMBULATORY_CARE_PROVIDER_SITE_OTHER): Payer: Medicaid Other | Admitting: Obstetrics & Gynecology

## 2012-09-13 DIAGNOSIS — O99810 Abnormal glucose complicating pregnancy: Secondary | ICD-10-CM

## 2012-09-13 DIAGNOSIS — N390 Urinary tract infection, site not specified: Secondary | ICD-10-CM

## 2012-09-13 DIAGNOSIS — O99815 Abnormal glucose complicating the puerperium: Secondary | ICD-10-CM

## 2012-09-13 LAB — POCT URINALYSIS DIP (DEVICE)
Bilirubin Urine: NEGATIVE
Glucose, UA: NEGATIVE mg/dL
Nitrite: NEGATIVE
Specific Gravity, Urine: 1.02 (ref 1.005–1.030)
pH: 5.5 (ref 5.0–8.0)

## 2012-09-13 MED ORDER — SULFAMETHOXAZOLE-TRIMETHOPRIM 800-160 MG PO TABS: 1.0000 | ORAL_TABLET | Freq: Two times a day (BID) | ORAL | Status: AC

## 2012-09-13 NOTE — Progress Notes (Signed)
Here for postpartum visit. When completing depression screen, patient did indicate she had thought about harming herself before she was ever pregnant, but not since.   Today score=7.

## 2012-09-13 NOTE — Progress Notes (Signed)
  Subjective:     Rebecca Ayala is a 31 y.o. [phone] female who presents for a postpartum visit. Patient is Spanish-speaking only, Spanish interpreter present for this encounter.  She is 6 weeks postpartum following a spontaneous vaginal delivery. I have fully reviewed the prenatal and intrapartum course. The delivery was at 39 gestational weeks, after IOL for GDM.  Anesthesia: epidural. Postpartum course has been uncomplicated. Baby's course has been uncomplicated. Baby is feeding by breast. Bleeding no bleeding. Bowel function is normal. Bladder function is normal, but patient reports dysuria for the past couple of weeks.  Patient is not sexually active. Contraception method is Nexplanon. Postpartum depression screening: negative.  The following portions of the patient's history were reviewed and updated as appropriate: allergies, current medications, past family history, past medical history, past social history, past surgical history and problem list.  Review of Systems Pertinent items are noted in HPI.   Objective:    BP 112/75  Pulse 66  Temp 97.2 F (36.2 C)  Wt 146 lb 8 oz (66.452 kg)  Breastfeeding? Yes  General:  alert and no distress   Breasts:  inspection negative, no nipple discharge or bleeding, no masses or nodularity palpable  Lungs: clear to auscultation bilaterally  Heart:  regular rate and rhythm  Abdomen: soft, non-tender; bowel sounds normal; no masses,  no organomegaly   Vulva:  normal  Vagina: normal vagina  Cervix:  normal  Corpus: normal size, contour, position, consistency, mobility, non-tender  Adnexa:  normal adnexa  Rectal Exam: Not performed.      UA today +blood +LE, culture sent Assessment:    Normal postpartum exam. Pap smear not done at today's visit. Possible UTI.   Plan:   1. Contraception: Nexplanon 2. Will follow up for 2 hr GTT later 3. Bactrim DS bid x 3 days ordered, will follow up urine culture

## 2012-09-13 NOTE — Addendum Note (Signed)
Addended by: Franchot Mimes on: 09/13/2012 04:18 PM   Modules accepted: Orders

## 2012-09-13 NOTE — Patient Instructions (Signed)
Eleccin del mtodo anticonceptivo  (Contraception Choices) La anticoncepcin (control de la natalidad) es el uso de cualquier mtodo o dispositivo para evitar el embarazo. A continuacin se indican algunos de esos mtodos.  MTODOS HORMONALES   Implante anticonceptivo. Es un tubo plstico delgado que contiene la hormona progesterona. No contiene estrgenos. El mdico inserta el tubo en la parte interna del brazo. El tubo puede permanecer en el lugar durante 3 aos. Despus de los 3 aos debe retirarse. El implante impide que los ovarios liberen vulos (ovulacin), espesa el moco cervical, lo que evita que los espermatozoides ingresen al tero y hace ms delgada la membrana que cubre el interior del tero.  Inyecciones de progesterona sola. Estas inyecciones se administran cada 3 meses para evitar el embarazo. La progesterona sinttica impide que los ovarios liberen vulos. Tambin hace que el moco cervical se espese y modifica el recubrimiento interno del tero. Esto hace ms difcil que los espermatozoides sobrevivan en el tero.  Pldoras anticonceptivas. Las pldoras anticonceptivas contienen estrgenos y progesterona. Actan impidiendo que el vulo se forme en el ovario(ovulacin). Las pldoras anticonceptivas son recetadas por el mdico.Tambin se utilizan para tratar los perodos menstruales abundantes.  Minipldora. Este tipo de pldora anticonceptiva contiene slo hormona progesterona. Deben tomarse todos los das del mes y debe recetarlas el mdico.  Parches anticonceptivos. El parche contiene hormonas similares a las que contienen las pldoras anticonceptivas. Deben cambiarse una vez por semana y se utilizan bajo prescripcin mdica.  Anillo vaginal. Anillo vaginal contiene hormonas similares a las que contienen las pldoras anticonceptivas. Se deja colocado durante tres semanas, se lo retira durante 1 semana y luego se coloca uno nuevo. La paciente debe sentirse cmoda para insertar y  retirar el anillo de la vagina.Es necesaria la receta del mdico.  Anticonceptivos de emergencia. Los anticonceptivos de emergencia son mtodos para evitar un embarazo despus de una relacin sexual sin proteccin. Esta pldora puede tomarse inmediatamente despus de tener relaciones sexuales o hasta 5 das de haber tenido sexo sin proteccin. Es ms efectiva si se toma poco tiempo despus. Los anticonceptivos de emergencia estn disponibles sin prescripcin mdica. Consltelo con su farmacutico. No use los anticonceptivos de emergencia como nico mtodo anticonceptivo. MTODOS DE BARRERA   Condn masculino. Es una vaina delgada (ltex o goma) que se usa en el pene durante el acto sexual. Puede usarse con espermicida para aumentar la efectividad.  Condn femenino. Es una vaina blanda y floja que se adapta suavemente a la vagina antes de las relaciones sexuales.  Diafragma. Es una barrera de ltex redonda y suave que debe ser ajustada por un profesional. Se inserta en la vagina, junto con un gel espermicida. Debe insertarse antes de tener relaciones sexuales. Debe dejar el diafragma colocado en la vagina durante 6 a 8 horas despus de la relacin sexual.  Capuchn cervical. Es una taza de ltex o plstico, redonda y suave que cubre el cuello del tero y debe ser ajustada por un mdico. Puede dejarlo colocado en la vagina hasta 48 horas despus de las relaciones sexuales.  Esponja. Es una pieza blanda y circular de espuma de poliuretano. Contiene un espermicida. Se inserta en la vagina despus de mojarla y antes de las relaciones sexuales.  Espermicidas. Los espermicidas son qumicos que matan o bloquean el esperma y no lo dejan ingresar al cuello del tero y al tero. Vienen en forma de cremas, geles, supositorios, espuma o comprimidos. No es necesario tener receta mdica. Se insertan en la vagina con un aplicador   antes de tener relaciones sexuales. El proceso debe repetirse cada vez que tiene  relaciones sexuales. ANTICONCEPTIVOS INTRAUTERINOS   Dispositivo intrauterino (DIU). Es un dispositivo en forma de T que se coloca en el tero durante el perodo menstrual, para evitar el embarazo. Hay dos tipos:  DIU de cobre. Este tipo de DIU est recubierto con un alambre de cobre y se inserta dentro del tero. El cobre hace que el tero y las trompas de Falopio produzcan un liquido que destruye los espermatozoides. Puede permanecer colocado durante 10 aos.  DIU hormonal. Este tipo de DIU contiene la hormona progestina (progesterona sinttica). La hormona espesa el moco cervical y evita que los espermatozoides ingresen al tero y tambin afina la membrana que cubre el tero para evitar la implantacin del vulo fertilizado. La hormona debilita o destruye los espermatozoides que ingresan al tero. Puede permanecer colocado durante 5 aos. MTODOS ANTICONCEPTIVOS PERMANENTES   Ligadura de trompas en la mujer. La ligadura de trompas en la mujer se realiza sellando, atando u obstruyendo quirrgicamente las trompas de Falopio lo que impide que el vulo descienda hacia el tero.  Esterilizacin masculina. Se realiza atando los conductos por los que pasan los espermatozoides (vasectoma).Esto impide que el esperma ingrese a la vagina durante el acto sexual. Luego del procedimiento, el hombre puede eyacular lquido (semen). MTODOS DE PLANIFICACIN NATURAL   Planificacin familiar natural.  Consiste en no tener relaciones sexuales o usar un mtodo de barrera (condn, diafragma, capuchn cervical) en los das que la mujer podra quedar embarazada.  Mtodo calendario.  Consiste en el seguimiento de la duracin de cada ciclo menstrual y la identificacin de los perodos frtiles.  Mtodo de la ovulacin.  Consiste en evitar las relaciones sexuales durante la ovulacin.  Mtodo sintotrmico. Consiste en evitar las relaciones sexuales en la poca en la que se est ovulando, utilizando un termmetro y  tendiendo en cuenta los sntomas de la ovulacin.  Mtodo post-ovulacin. Consiste en planificar las relaciones sexuales para despus de haber ovulado. Independientemente del tipo o mtodo anticonceptivo que usted elija, es importante que use condones para protegerse contra las enfermedades de transmisin sexual (ETS). Hable con su mdico con respecto a qu mtodo anticonceptivo es el ms apropiado para usted.  Document Released: 09/08/2005 Document Revised: 12/01/2011 [employer] Patient Information 2013 [employer].  

## 2012-09-14 LAB — URINE CULTURE

## 2012-09-24 ENCOUNTER — Other Ambulatory Visit: Payer: Medicaid Other

## 2012-09-29 ENCOUNTER — Encounter: Payer: Self-pay | Admitting: *Deleted

## 2012-10-29 ENCOUNTER — Other Ambulatory Visit: Payer: Medicaid Other

## 2012-11-02 ENCOUNTER — Other Ambulatory Visit: Payer: Self-pay

## 2012-11-02 DIAGNOSIS — O99814 Abnormal glucose complicating childbirth: Secondary | ICD-10-CM

## 2012-11-03 ENCOUNTER — Telehealth: Payer: Self-pay | Admitting: *Deleted

## 2012-11-03 ENCOUNTER — Encounter: Payer: Self-pay | Admitting: *Deleted

## 2012-11-03 LAB — GLUCOSE TOLERANCE, 2 HOURS W/ 1HR
Glucose, 1 hour: 79 mg/dL (ref 70–170)
Glucose, 2 hour: 100 mg/dL (ref 70–139)
Glucose, Fasting: 78 mg/dL (ref 70–99)

## 2012-11-03 NOTE — Telephone Encounter (Signed)
Letter sent to patient with results.

## 2012-11-03 NOTE — Telephone Encounter (Signed)
Attempted to call patient. No answer at her number or her husbands.

## 2012-11-03 NOTE — Telephone Encounter (Signed)
Message copied by Mannie Stabile on Wed Nov 03, 2012  1:31 PM ------      Message from: Rebecca Ayala      Created: Wed Nov 03, 2012  1:20 PM       Please notify pt of normal 2 hour GTT.            [id number]-[id number] ------

## 2014-07-24 ENCOUNTER — Encounter: Payer: Self-pay | Admitting: Obstetrics & Gynecology

## 2016-12-25 ENCOUNTER — Ambulatory Visit: Payer: Self-pay | Admitting: Internal Medicine

## 2017-07-18 ENCOUNTER — Ambulatory Visit (HOSPITAL_COMMUNITY)
Admission: EM | Admit: 2017-07-18 | Discharge: 2017-07-18 | Disposition: A | Discharge status: Home / Self Care | Payer: Self-pay

## 2017-07-18 ENCOUNTER — Encounter (HOSPITAL_COMMUNITY): Payer: Self-pay | Admitting: Emergency Medicine

## 2017-07-18 DIAGNOSIS — L03213 Periorbital cellulitis: Secondary | ICD-10-CM

## 2017-07-18 MED ORDER — SULFAMETHOXAZOLE-TRIMETHOPRIM 800-160 MG PO TABS: 1.0000 | ORAL_TABLET | Freq: Two times a day (BID) | ORAL | 0 refills | Status: AC

## 2017-07-18 NOTE — ED Provider Notes (Signed)
[facility]    CSN: [id number] Arrival date & time: 07/18/17  1529     History   Chief Complaint Chief Complaint  Patient presents with  . Eye Problem    HPI Rebecca Ayala is a 36 y.o. female.   Subjective:  Lee's note that patient is primarily Spanish speaking and translation provided by staff member in clinic.  Rebecca Ayala is a 36 y.o. female who presents for evaluation of decreased vision, redness, itchiness and swelling of the right upper eyelid. She has noticed the above symptoms for 2 days. Onset was acute. Symptoms have also included tearing. Patient denies any visual field deficit, eye pain, fevers, congestion, nausea, vomiting or URI type symptoms. There is no history of contact lens use, exposure to chemicals, other family members with similar symptoms, eye trauma or eye glass use.   The following portions of the patient's history were reviewed and updated as appropriate: allergies, current medications, past family history, past medical history, past social history, past surgical history and problem list.           Past Medical History:  Diagnosis Date  . Gastritis   . Headache(784.0)   . Hyperlipidemia     Patient Active Problem List   Diagnosis Date Noted  . Vaginal delivery 08/16/2012  . Gestational Diabetes 06/07/2012  . Supervision of high-risk pregnancy 04/21/2012  . Hx of molar pregnancy, antepartum 04/21/2012    Past Surgical History:  Procedure Laterality Date  . DILATION AND CURETTAGE OF UTERUS      OB History    Gravida Para Term Preterm AB Living   7 4 4  0 3 4   SAB TAB Ectopic Multiple Live Births   3 0 0 0 4       Home Medications    Prior to Admission medications   Medication Sig Start Date End Date Taking? Authorizing Provider  Fish Oil-Cholecalciferol (FISH OIL + D3 PO) Take by mouth.   Yes [provider]  guaiFENesin (ROBITUSSIN) 100 MG/5ML SOLN Take 5 mLs by mouth every 4  (four) hours as needed.    [provider]  ibuprofen (ADVIL,MOTRIN) 600 MG tablet Take 1 tablet (600 mg total) by mouth every 6 (six) hours. 08/16/12   Piloto de Criselda PeachesLa Paz, Donetta Pottsayarmys, MD  Prenat w/o A Vit-FeFum-FePo-FA (CONCEPT OB) 130-92.4-1 MG CAPS Take 1 tablet by mouth daily. 03/08/12   Dorathy KinsmanSmith, [place], CNM    Family History Family History  Problem Relation Age of Onset  . Diabetes Maternal Uncle     Social History Social History  Substance Use Topics  . Smoking status: Never Smoker  . Smokeless tobacco: Never Used  . Alcohol use No     Allergies   Patient has no known allergies.   Review of Systems Review of Systems  Constitutional: Negative for fever.  HENT: Negative for congestion and rhinorrhea.   Eyes: Positive for discharge, redness, itching and visual disturbance. Negative for photophobia and pain.     Physical Exam Triage Vital Signs ED Triage Vitals  Enc Vitals Group     BP 07/18/17 1550 121/81     Pulse Rate 07/18/17 1550 83     Resp 07/18/17 1550 18     Temp 07/18/17 1550 98.2 F (36.8 C)     Temp Source 07/18/17 1550 Oral     SpO2 07/18/17 1550 96 %     Weight --      Height --  Head Circumference --      Peak Flow --      Pain Score 07/18/17 1547 6     Pain Loc --      Pain Edu? --      Excl. in GC? --    No data found.   Updated Vital Signs BP 121/81 (BP Location: Left Arm)   Pulse 83   Temp 98.2 F (36.8 C) (Oral)   Resp 18   SpO2 96%   Visual Acuity Right Eye Distance:   Left Eye Distance:   Bilateral Distance:    Right Eye Near:   Left Eye Near:    Bilateral Near:     Physical Exam  Constitutional: She is oriented to person, place, and time. She appears well-developed and well-nourished.  Eyes: Pupils are equal, round, and reactive to light. Conjunctivae and EOM are normal. Right eye exhibits no discharge. Left eye exhibits no discharge. No scleral icterus.  Right upper eye lid redness and swelling noted. No pain  with eye movement  Cardiovascular: Normal rate and regular rhythm.   Pulmonary/Chest: Effort normal and breath sounds normal.  Musculoskeletal: Normal range of motion.  Neurological: She is alert and oriented to person, place, and time.  Skin: Skin is warm and dry.  Psychiatric: She has a normal mood and affect.     UC Treatments / Results  Labs (all labs ordered are listed, but only abnormal results are displayed) Labs Reviewed - No data to display  EKG  EKG Interpretation None       Radiology No results found.  Procedures Procedures (including critical care time)  Medications Ordered in UC Medications - No data to display   Initial Impression / Assessment and Plan / UC Course  I have reviewed the triage vital signs and the nursing notes.  Pertinent labs & imaging results that were available during my care of the patient were reviewed by me and considered in my medical decision making (see chart for details).     36 y.o. female who presents for evaluation of decreased vision, redness, itchiness and swelling of the right upper eyelid. No pain with eye movement. PERRLA. No pain with eye movement. Symptoms and exam consistent with preseptal cellulitis. Will prescribe 7-day course of Bactrim. Warm compress to eye. OTC analgesics as needed.  Discussed diagnosis and treatment with patient. All questions have been answered and all concerns have been addressed. The patient verbalized understanding and had no further questions    Final Clinical Impressions(s) / UC Diagnoses   Final diagnoses:  Preseptal cellulitis of right upper eyelid    New Prescriptions New Prescriptions   No medications on file     Controlled Substance Prescriptions [place] Controlled Substance Registry consulted? Not Applicable   Lurline Idol, [place] 07/18/17 [phone]

## 2017-07-18 NOTE — ED Triage Notes (Signed)
Right upper eyelid is red and swollen.  Woke 2 days ago with this issue.  Swelling has worsened.  Eyelid is in the way of vision in right eye, no known injury

## 2020-05-12 ENCOUNTER — Other Ambulatory Visit: Payer: Self-pay

## 2020-05-12 ENCOUNTER — Encounter (HOSPITAL_BASED_OUTPATIENT_CLINIC_OR_DEPARTMENT_OTHER): Payer: Self-pay | Admitting: *Deleted

## 2020-05-12 DIAGNOSIS — W260XXA Contact with knife, initial encounter: Secondary | ICD-10-CM | POA: Insufficient documentation

## 2020-05-12 DIAGNOSIS — Y9263 Factory as the place of occurrence of the external cause: Secondary | ICD-10-CM | POA: Diagnosis not present

## 2020-05-12 DIAGNOSIS — S41112A Laceration without foreign body of left upper arm, initial encounter: Secondary | ICD-10-CM | POA: Insufficient documentation

## 2020-05-12 DIAGNOSIS — Y999 Unspecified external cause status: Secondary | ICD-10-CM | POA: Insufficient documentation

## 2020-05-12 DIAGNOSIS — Z23 Encounter for immunization: Secondary | ICD-10-CM | POA: Insufficient documentation

## 2020-05-12 DIAGNOSIS — Y9389 Activity, other specified: Secondary | ICD-10-CM | POA: Diagnosis not present

## 2020-05-12 DIAGNOSIS — Z79899 Other long term (current) drug therapy: Secondary | ICD-10-CM | POA: Diagnosis not present

## 2020-05-12 DIAGNOSIS — S4992XA Unspecified injury of left shoulder and upper arm, initial encounter: Secondary | ICD-10-CM | POA: Diagnosis present

## 2020-05-12 NOTE — ED Triage Notes (Signed)
Triage using video interpreter [phone]. Pt works at [employer] and comes in with injury to left arm (ac region) from a knife used to cut plastic

## 2020-05-13 ENCOUNTER — Emergency Department (HOSPITAL_BASED_OUTPATIENT_CLINIC_OR_DEPARTMENT_OTHER)
Admission: EM | Admit: 2020-05-13 | Discharge: 2020-05-13 | Disposition: A | Discharge status: Home / Self Care | Payer: No Typology Code available for payment source | Attending: Emergency Medicine | Admitting: Emergency Medicine

## 2020-05-13 DIAGNOSIS — S41112A Laceration without foreign body of left upper arm, initial encounter: Secondary | ICD-10-CM

## 2020-05-13 MED ORDER — IBUPROFEN 400 MG PO TABS
ORAL_TABLET | ORAL | Status: AC
  Filled 2020-05-13: qty 1

## 2020-05-13 MED ORDER — LIDOCAINE-EPINEPHRINE 1 %-1:100000 IJ SOLN
INTRAMUSCULAR | Status: AC
  Filled 2020-05-13: qty 1

## 2020-05-13 MED ORDER — IBUPROFEN 400 MG PO TABS
400.0000 mg | ORAL_TABLET | Freq: Once | ORAL | Status: AC | PRN
  Administered 2020-05-13: 400 mg via ORAL

## 2020-05-13 MED ORDER — CEPHALEXIN 500 MG PO CAPS: 500.0000 mg | ORAL_CAPSULE | Freq: Three times a day (TID) | ORAL | 0 refills | Status: DC

## 2020-05-13 MED ORDER — TETANUS-DIPHTH-ACELL PERTUSSIS 5-2.5-18.5 LF-MCG/0.5 IM SUSP
0.5000 mL | Freq: Once | INTRAMUSCULAR | Status: AC
  Administered 2020-05-13: 0.5 mL via INTRAMUSCULAR
  Filled 2020-05-13: qty 0.5

## 2020-05-13 NOTE — Discharge Instructions (Signed)
Tome antibiticos segn las indicaciones. No se moje la herida durante las prximas 24 horas, evite levantar objetos pesados en el trabajo durante los prximos [place], puede tomar ibuprofeno como se indica en el frasco para Chief Technology Officer. CUIDADO DE HERIDAS Regrese en 7 das para recibir su puntos / grapas retirados o antes si tiene dudas.  Mantenga el rea limpia y seca durante 24 horas. No quitar vendaje, si se aplica.  Despus de 24 horas, retire el vendaje y lave la herida. suavemente con un jabn suave y agua tibia. Aplicar de nuevo un nuevo vendaje despus de limpiar la herida, si se le indica.  Contine la limpieza diaria con agua y jabn hasta Se quitan las puntadas / grapas.  No aplique pomadas ni cremas en la herida. mientras las puntadas / grapas estn en su lugar, ya que esto puede causar curacin retrasada.  Notifique a la oficina si experimenta alguno de los siguientes signos de infeccin: hinchazn, enrojecimiento, secrecin de pus, estras, fiebre> 101,0 F  Notifique a la oficina si experimenta sangrado excesivo que no se detiene despus de 15-20 minutos de constante, firme presin.    Take antibiotics as directed.  Do not get wound wet for the next 24 hours, avoid heavy lifting at work for the next couple of days, you can take ibuprofen as directed on the bottle for pain. WOUND CARE Please return in 7 days to have your stitches/staples removed or sooner if you have concerns.  Keep area clean and dry for 24 hours. Do not remove bandage, if applied.  After 24 hours, remove bandage and wash wound gently with mild soap and warm water. Reapply a new bandage after cleaning wound, if directed.  Continue daily cleansing with soap and water until stitches/staples are removed.  Do not apply any ointments or creams to the wound while stitches/staples are in place, as this may cause delayed healing.  Notify the office if you experience any of the following signs of infection:  Swelling, redness, pus drainage, streaking, fever >101.0 F  Notify the office if you experience excessive bleeding that does not stop after 15-20 minutes of constant, firm pressure.

## 2020-05-13 NOTE — ED Notes (Signed)
ED Provider at bedside for lac repair 

## 2020-05-13 NOTE — ED Provider Notes (Signed)
[facility] EMERGENCY DEPARTMENT Provider Note   CSN: [id number] Arrival date & time: 05/12/20  2129     History Chief Complaint  Patient presents with  . Laceration    Rebecca Ayala is a 39 y.o. female with pertinent past medical history of hyperlipidemia that presents to the emergency department today for a laceration that occurred last night.  Patient is Spanish-speaking and Spanish interpreter was used. Not up-to-date on tetanus. No numbness and tingling down arm. No pain radiating down arm. States that she cut herself with a knife, works at [employer]. No previous injury to this area. Has not taken anything for this, was given Ibuprofen. Has been waiting in the waiting room for 15 hours, wound has begun healing process. NO nausea, vomiting, fevers, chills, pain elsewhere. No blood thinners, bleeding has been controlled.   HPI     Past Medical History:  Diagnosis Date  . Gastritis   . Headache(784.0)   . Hyperlipidemia     Patient Active Problem List   Diagnosis Date Noted  . Vaginal delivery 08/16/2012  . Gestational Diabetes 06/07/2012  . Supervision of high-risk pregnancy 04/21/2012  . Hx of molar pregnancy, antepartum 04/21/2012    Past Surgical History:  Procedure Laterality Date  . DILATION AND CURETTAGE OF UTERUS       OB History    Gravida  7   Para  4   Term  4   Preterm  0   AB  3   Living  4     SAB  3   TAB  0   Ectopic  0   Multiple  0   Live Births  4           Family History  Problem Relation Age of Onset  . Diabetes Maternal Uncle     Social History   Tobacco Use  . Smoking status: Never Smoker  . Smokeless tobacco: Never Used  Substance Use Topics  . Alcohol use: No  . Drug use: No    Home Medications Prior to Admission medications   Medication Sig Start Date End Date Taking? Authorizing Provider  cephALEXin (KEFLEX) 500 MG capsule Take 1 capsule (500 mg total) by mouth 3 (three)  times daily. 05/13/20   Farrel Gordon, PA-C  Fish Oil-Cholecalciferol (FISH OIL + D3 PO) Take by mouth.    [provider]  guaiFENesin (ROBITUSSIN) 100 MG/5ML SOLN Take 5 mLs by mouth every 4 (four) hours as needed.    [provider]  ibuprofen (ADVIL,MOTRIN) 600 MG tablet Take 1 tablet (600 mg total) by mouth every 6 (six) hours. 08/16/12   Piloto de Criselda Peaches, Donetta Potts, MD  Prenat w/o A Vit-FeFum-FePo-FA (CONCEPT OB) 130-92.4-1 MG CAPS Take 1 tablet by mouth daily. 03/08/12   Katrinka Blazing, [place], CNM    Allergies    Patient has no known allergies.  Review of Systems   Review of Systems  Constitutional: Negative for chills, diaphoresis, fatigue and fever.  HENT: Negative for congestion, sore throat and trouble swallowing.   Eyes: Negative for pain and visual disturbance.  Respiratory: Negative for cough, shortness of breath and wheezing.   Cardiovascular: Negative for chest pain, palpitations and leg swelling.  Gastrointestinal: Negative for abdominal distention, abdominal pain, diarrhea, nausea and vomiting.  Genitourinary: Negative for difficulty urinating.  Musculoskeletal: Negative for back pain, neck pain and neck stiffness.  Skin: Positive for wound. Negative for pallor.  Neurological: Negative for dizziness, speech difficulty, weakness and  headaches.  Psychiatric/Behavioral: Negative for confusion.    Physical Exam Updated Vital Signs BP 137/86 (BP Location: Right Arm)   Pulse 64   Temp 98.4 F (36.9 C) (Oral)   Resp 16   Ht 4\' 8"  (1.422 m)   Wt 65.8 kg   LMP 04/11/2020 (Approximate)   SpO2 100%   Breastfeeding No   BMI 32.51 kg/m   Physical Exam Constitutional:      General: She is not in acute distress.    Appearance: Normal appearance. She is not ill-appearing, toxic-appearing or diaphoretic.  Cardiovascular:     Rate and Rhythm: Normal rate and regular rhythm.     Pulses: Normal pulses.  Pulmonary:     Effort: Pulmonary effort is normal.      Breath sounds: Normal breath sounds.  Musculoskeletal:        General: Normal range of motion.       Arms:     Comments: 4 cm laceration to left upper extremity with subcutaneous fat displayed as affected in picture. Is less than 3 mm deep. Bleeding has been controlled. Patient is able to move arm in all directions with minimal pain. No swelling around that area. Normal strength and sensation to entire upper extremity. Radial pulse 2+. Cap refill less than 2 seconds.  Skin:    General: Skin is warm and dry.     Capillary Refill: Capillary refill takes less than 2 seconds.  Neurological:     General: No focal deficit present.     Mental Status: She is alert and oriented to person, place, and time.  Psychiatric:        Mood and Affect: Mood normal.        Behavior: Behavior normal.        Thought Content: Thought content normal.       ED Results / Procedures / Treatments   Labs (all labs ordered are listed, but only abnormal results are displayed) Labs Reviewed - No data to display  EKG None  Radiology No results found.  Procedures .07/23/2021Laceration Repair  Date/Time: 05/13/2020 12:42 PM Performed by: 05/15/2020, PA-C Authorized by: Farrel Gordon, PA-C   Consent:    Consent obtained:  Verbal   Consent given by:  Patient   Risks discussed:  Infection, need for additional repair, pain, poor cosmetic result and poor wound healing   Alternatives discussed:  No treatment and delayed treatment Universal protocol:    Procedure explained and questions answered to patient or proxy's satisfaction: yes     Relevant documents present and verified: yes     Test results available and properly labeled: yes     Imaging studies available: yes     Required blood products, implants, devices, and special equipment available: yes     Site/side marked: yes     Immediately prior to procedure, a time out was called: yes     Patient identity confirmed:  Verbally with patient Anesthesia (see  MAR for exact dosages):    Anesthesia method:  Local infiltration   Local anesthetic:  Lidocaine 1% WITH epi Laceration details:    Location:  Shoulder/arm   Shoulder/arm location:  L upper arm   Length (cm):  4   Depth (mm):  3 Repair type:    Repair type:  Simple Exploration:    Hemostasis achieved with:  Direct pressure   Wound exploration: wound explored through full range of motion and entire depth of wound probed and visualized  Contaminated: no   Treatment:    Area cleansed with:  Betadine, saline and Hibiclens   Amount of cleaning:  Extensive   Irrigation solution:  Sterile saline   Irrigation volume:  >500cc   Irrigation method:  Pressure wash Skin repair:    Repair method:  Sutures and tissue adhesive   Suture size:  5-0   Suture material:  Prolene   Suture technique:  Simple interrupted and horizontal mattress   Number of sutures:  5 Post-procedure details:    Dressing:  Antibiotic ointment   Patient tolerance of procedure:  Tolerated well, no immediate complications   (including critical care time)  Medications Ordered in ED Medications  lidocaine-EPINEPHrine (XYLOCAINE W/EPI) 1 %-1:100000 (with pres) injection (has no administration in time range)  ibuprofen (ADVIL) tablet 400 mg (400 mg Oral Given 05/13/20 0959)  Tdap (BOOSTRIX) injection 0.5 mL (0.5 mLs Intramuscular Given 05/13/20 1209)    ED Course  I have reviewed the triage vital signs and the nursing notes.  Pertinent labs & imaging results that were available during my care of the patient were reviewed by me and considered in my medical decision making (see chart for details).    MDM Rules/Calculators/A&P                         Rebecca Ayala is a 39 y.o. female with pertinent past medical history of hyperlipidemia that presents to the emergency department today for a laceration that occurred last night. Laceration was sutured, no immediate complications. Was irrigated extensively.Tetanus  given. Due to time it has been open and width of laceration will give antibiotics. Pt is distally neurovascularly intact, with no pain or paresthesias into arm. Pt to be discharged, wound care info given. Return precautions given in depth.   Doubt need for further emergent work up at this time. I explained the diagnosis and have given explicit precautions to return to the ER including for any other new or worsening symptoms. The patient understands and accepts the medical plan as it's been dictated and I have answered their questions. Discharge instructions concerning home care and prescriptions have been given. The patient is STABLE and is discharged to home in good condition.   Final Clinical Impression(s) / ED Diagnoses Final diagnoses:  Laceration of left upper extremity, initial encounter    Rx / DC Orders ED Discharge Orders         Ordered    cephALEXin (KEFLEX) 500 MG capsule  3 times daily        05/13/20 1321           Farrel Gordon, [place] 05/13/20 2128    Tegeler, Canary Brim, MD 05/15/20 [phone]

## 2020-05-13 NOTE — ED Notes (Signed)
ED Provider at bedside. 

## 2022-06-01 ENCOUNTER — Encounter (HOSPITAL_COMMUNITY): Payer: Self-pay | Admitting: Emergency Medicine

## 2022-06-01 ENCOUNTER — Other Ambulatory Visit: Payer: Self-pay

## 2022-06-01 ENCOUNTER — Emergency Department (HOSPITAL_COMMUNITY)
Admission: EM | Admit: 2022-06-01 | Discharge: 2022-06-01 | Disposition: A | Discharge status: Home / Self Care | Payer: Self-pay | Attending: Emergency Medicine | Admitting: Emergency Medicine

## 2022-06-01 DIAGNOSIS — T161XXA Foreign body in right ear, initial encounter: Secondary | ICD-10-CM | POA: Insufficient documentation

## 2022-06-01 DIAGNOSIS — X58XXXA Exposure to other specified factors, initial encounter: Secondary | ICD-10-CM | POA: Insufficient documentation

## 2022-06-01 NOTE — ED Triage Notes (Signed)
Patient reports cotton tip lodged in her right ear this evening .

## 2022-06-01 NOTE — Discharge Instructions (Addendum)
Please follow up with ENT in 1 week if you continue to feel as if something is in your ear Please avoid sticking qtips in your ear in the future Please read attached guide concerning ear foreign bodies

## 2022-06-01 NOTE — ED Provider Notes (Signed)
[facility] EMERGENCY DEPARTMENT Provider Note   CSN: [id number] Arrival date & time: 06/01/22  0133     History  Chief Complaint  Patient presents with   Cotton Tip in right ear    Rebecca Ayala is a 41 y.o. female with medical history of gastritis, headaches.  Patient presents to ED for evaluation of foreign body in right ear.  Patient reports that last night she was cleaning her ear with Q-tips when the Q-tip became dislodged from its plastic applicator.  The patient reports that the Q-tip cotton became lodged in her ear at this point.  Patient states that she had a friend attempt to remove some of the Q-tip which she was successful doing.  The patient then reports that she flushed her ear which caused cloudy appearing liquid to external ear canal.  The patient denies decreased hearing, pain in her ear.  The patient does report a "pressure" in her ear.  HPI     Home Medications Prior to Admission medications   Medication Sig Start Date End Date Taking? Authorizing Provider  cephALEXin (KEFLEX) 500 MG capsule Take 1 capsule (500 mg total) by mouth 3 (three) times daily. 05/13/20   Farrel Gordon, PA-C  Fish Oil-Cholecalciferol (FISH OIL + D3 PO) Take by mouth.    [provider]  guaiFENesin (ROBITUSSIN) 100 MG/5ML SOLN Take 5 mLs by mouth every 4 (four) hours as needed.    [provider]  ibuprofen (ADVIL,MOTRIN) 600 MG tablet Take 1 tablet (600 mg total) by mouth every 6 (six) hours. 08/16/12   Piloto de Criselda Peaches, Donetta Potts, MD  Prenat w/o A Vit-FeFum-FePo-FA (CONCEPT OB) 130-92.4-1 MG CAPS Take 1 tablet by mouth daily. 03/08/12   Katrinka Blazing, [place], CNM      Allergies    Patient has no known allergies.    Review of Systems   Review of Systems  HENT:  Negative for ear discharge, ear pain and hearing loss.   All other systems reviewed and are negative.   Physical Exam Updated Vital Signs BP 123/72 (BP Location: Right Arm)   Pulse 67    Temp 98.1 F (36.7 C) (Oral)   Resp 15   SpO2 99%  Physical Exam Vitals and nursing note reviewed.  Constitutional:      General: She is not in acute distress.    Appearance: Normal appearance. She is not ill-appearing, toxic-appearing or diaphoretic.  HENT:     Head: Normocephalic and atraumatic.     Right Ear: Tympanic membrane, ear canal and external ear normal. No foreign body.     Left Ear: Tympanic membrane, ear canal and external ear normal. No foreign body.     Ears:     Comments: Patient right external auditory canal without findings of erythema.  There is no obvious foreign body in the patient's ear.  Patient tympanic membrane is noted without signs of bulging.      Nose: Nose normal. No congestion.     Mouth/Throat:     Mouth: Mucous membranes are moist.     Pharynx: Oropharynx is clear.  Eyes:     Extraocular Movements: Extraocular movements intact.     Conjunctiva/sclera: Conjunctivae normal.     Pupils: Pupils are equal, round, and reactive to light.  Cardiovascular:     Rate and Rhythm: Normal rate and regular rhythm.  Pulmonary:     Effort: Pulmonary effort is normal.     Breath sounds: Normal breath sounds. No wheezing.  Abdominal:     General: Abdomen is flat. Bowel sounds are normal.     Palpations: Abdomen is soft.     Tenderness: There is no abdominal tenderness.  Musculoskeletal:     Cervical back: Normal range of motion and neck supple. No tenderness.  Skin:    General: Skin is warm and dry.     Capillary Refill: Capillary refill takes less than 2 seconds.  Neurological:     Mental Status: She is alert and oriented to person, place, and time.     ED Results / Procedures / Treatments   Labs (all labs ordered are listed, but only abnormal results are displayed) Labs Reviewed - No data to display  EKG None  Radiology No results found.  Procedures Procedures   Medications Ordered in ED Medications - No data to display  ED Course/ Medical  Decision Making/ A&P                           Medical Decision Making  41 year old female presents to ED for evaluation.  Please see HPI for further details.  On examination the patient has no obvious foreign body in her right ear.  The patient right ear was flushed without any findings.  Patient denies any foreign body sensation only a "pressure".  The patient external auditory canal on the right was explored thoroughly and there was no findings of cotton tip applicators appreciated.  The patient hearing is intact.  There is no erythema to her auditory canal.  There is no perforation of tympanic membrane.  I believe that when the patient flushed her ear with water the cotton dissolved in the water and drained out on its own.  The patient reports that her friend was able to remove a small amount of the cotton tip applicator prior to her arrival in the ED.  At this time the patient will be discharged home and advised to follow-up with ENT if she has persistent feeling of foreign body in her ear after 1 week.  The patient was given return precautions and she voiced understanding of these.  The patient had all of her questions answered to her satisfaction prior to discharge.  The patient is stable this time for discharge home.  Final Clinical Impression(s) / ED Diagnoses Final diagnoses:  Foreign body of right ear, initial encounter    Rx / DC Orders ED Discharge Orders     None         Al Decant, PA-C 06/01/22 1101    Alvira Monday, MD 06/01/22 2228

## 2023-07-05 ENCOUNTER — Other Ambulatory Visit: Payer: Self-pay

## 2023-07-05 ENCOUNTER — Inpatient Hospital Stay (HOSPITAL_COMMUNITY)
Admission: EM | Admit: 2023-07-05 | Discharge: 2023-07-08 | DRG: 149 | Disposition: A | Discharge status: Home / Self Care | Payer: Self-pay | Attending: Internal Medicine | Admitting: Internal Medicine

## 2023-07-05 ENCOUNTER — Emergency Department (HOSPITAL_COMMUNITY): Payer: Self-pay

## 2023-07-05 ENCOUNTER — Encounter (HOSPITAL_COMMUNITY): Payer: Self-pay | Admitting: Pharmacy Technician

## 2023-07-05 ENCOUNTER — Observation Stay (HOSPITAL_COMMUNITY): Payer: Self-pay

## 2023-07-05 ENCOUNTER — Ambulatory Visit
Admission: EM | Admit: 2023-07-05 | Discharge: 2023-07-05 | Disposition: A | Discharge status: Home / Self Care | Payer: 59

## 2023-07-05 DIAGNOSIS — H5509 Other forms of nystagmus: Secondary | ICD-10-CM | POA: Diagnosis present

## 2023-07-05 DIAGNOSIS — R112 Nausea with vomiting, unspecified: Secondary | ICD-10-CM

## 2023-07-05 DIAGNOSIS — E785 Hyperlipidemia, unspecified: Secondary | ICD-10-CM | POA: Diagnosis present

## 2023-07-05 DIAGNOSIS — E876 Hypokalemia: Secondary | ICD-10-CM | POA: Diagnosis present

## 2023-07-05 DIAGNOSIS — E872 Acidosis, unspecified: Secondary | ICD-10-CM | POA: Diagnosis present

## 2023-07-05 DIAGNOSIS — R42 Dizziness and giddiness: Principal | ICD-10-CM

## 2023-07-05 DIAGNOSIS — R739 Hyperglycemia, unspecified: Secondary | ICD-10-CM | POA: Diagnosis present

## 2023-07-05 DIAGNOSIS — N6489 Other specified disorders of breast: Secondary | ICD-10-CM | POA: Insufficient documentation

## 2023-07-05 LAB — I-STAT CHEM 8, ED
BUN: 8 mg/dL (ref 6–20)
Calcium, Ion: 1.1 mmol/L — ABNORMAL LOW (ref 1.15–1.40)
Chloride: 104 mmol/L (ref 98–111)
Creatinine, Ser: 0.6 mg/dL (ref 0.44–1.00)
Glucose, Bld: 196 mg/dL — ABNORMAL HIGH (ref 70–99)
HCT: 44 % (ref 36.0–46.0)
Hemoglobin: 15 g/dL (ref 12.0–15.0)
Potassium: 3.1 mmol/L — ABNORMAL LOW (ref 3.5–5.1)
Sodium: 140 mmol/L (ref 135–145)
TCO2: 20 mmol/L — ABNORMAL LOW (ref 22–32)

## 2023-07-05 LAB — CBC
HCT: 42.2 % (ref 36.0–46.0)
Hemoglobin: 14.3 g/dL (ref 12.0–15.0)
MCH: 30.8 pg (ref 26.0–34.0)
MCHC: 33.9 g/dL (ref 30.0–36.0)
MCV: 90.9 fL (ref 80.0–100.0)
Platelets: 285 10*3/uL (ref 150–400)
RBC: 4.64 MIL/uL (ref 3.87–5.11)
RDW: 13.2 % (ref 11.5–15.5)
WBC: 9 10*3/uL (ref 4.0–10.5)
nRBC: 0 % (ref 0.0–0.2)

## 2023-07-05 LAB — BASIC METABOLIC PANEL
Anion gap: 12 (ref 5–15)
BUN: 9 mg/dL (ref 6–20)
CO2: 19 mmol/L — ABNORMAL LOW (ref 22–32)
Calcium: 8.7 mg/dL — ABNORMAL LOW (ref 8.9–10.3)
Chloride: 106 mmol/L (ref 98–111)
Creatinine, Ser: 0.72 mg/dL (ref 0.44–1.00)
GFR, Estimated: >60 mL/min (ref >60)
Glucose, Bld: 197 mg/dL — ABNORMAL HIGH (ref 70–99)
Potassium: 3.1 mmol/L — ABNORMAL LOW (ref 3.5–5.1)
Sodium: 137 mmol/L (ref 135–145)

## 2023-07-05 LAB — LACTIC ACID, PLASMA
Lactic Acid, Venous: 2.5 mmol/L (ref 0.5–1.9)
Lactic Acid, Venous: 1.1 mmol/L (ref 0.5–1.9)

## 2023-07-05 LAB — CBG MONITORING, ED: Glucose-Capillary: 190 mg/dL — ABNORMAL HIGH (ref 70–99)

## 2023-07-05 LAB — I-STAT CG4 LACTIC ACID, ED
Lactic Acid, Venous: 5.8 mmol/L (ref 0.5–1.9)
Lactic Acid, Venous: 4.7 mmol/L (ref 0.5–1.9)
Lactic Acid, Venous: 3.8 mmol/L (ref 0.5–1.9)

## 2023-07-05 LAB — MRSA NEXT GEN BY PCR, NASAL: MRSA by PCR Next Gen: NOT DETECTED

## 2023-07-05 LAB — GLUCOSE, CAPILLARY: Glucose-Capillary: 124 mg/dL — ABNORMAL HIGH (ref 70–99)

## 2023-07-05 MED ORDER — SODIUM CHLORIDE 0.9 % IV SOLN: INTRAVENOUS | Status: DC

## 2023-07-05 MED ORDER — SODIUM CHLORIDE 0.9 % IV BOLUS
500.0000 mL | Freq: Once | INTRAVENOUS | Status: AC
  Administered 2023-07-05: 500 mL via INTRAVENOUS

## 2023-07-05 MED ORDER — DIAZEPAM 5 MG/ML IJ SOLN
5.0000 mg | Freq: Once | INTRAMUSCULAR | Status: AC
  Administered 2023-07-05: 5 mg via INTRAVENOUS
  Filled 2023-07-05: qty 2

## 2023-07-05 MED ORDER — ONDANSETRON HCL 4 MG PO TABS: 4.0000 mg | ORAL_TABLET | Freq: Four times a day (QID) | ORAL | Status: DC | PRN

## 2023-07-05 MED ORDER — MECLIZINE HCL 25 MG PO TABS
25.0000 mg | ORAL_TABLET | Freq: Once | ORAL | Status: AC
  Administered 2023-07-05: 25 mg via ORAL
  Filled 2023-07-05: qty 1

## 2023-07-05 MED ORDER — POTASSIUM CHLORIDE 10 MEQ/100ML IV SOLN
10.0000 meq | INTRAVENOUS | Status: AC
  Administered 2023-07-05 (×4): 10 meq via INTRAVENOUS
  Filled 2023-07-05 (×4): qty 100

## 2023-07-05 MED ORDER — ACETAMINOPHEN 650 MG RE SUPP: 650.0000 mg | Freq: Four times a day (QID) | RECTAL | Status: DC | PRN

## 2023-07-05 MED ORDER — ALBUTEROL SULFATE (2.5 MG/3ML) 0.083% IN NEBU: 2.5000 mg | INHALATION_SOLUTION | RESPIRATORY_TRACT | Status: DC | PRN

## 2023-07-05 MED ORDER — SODIUM CHLORIDE 0.9 % IV BOLUS
1000.0000 mL | Freq: Once | INTRAVENOUS | Status: AC
  Administered 2023-07-05: 1000 mL via INTRAVENOUS

## 2023-07-05 MED ORDER — ONDANSETRON HCL 4 MG/2ML IJ SOLN
4.0000 mg | Freq: Four times a day (QID) | INTRAMUSCULAR | Status: DC | PRN
  Administered 2023-07-05: 4 mg via INTRAVENOUS
  Filled 2023-07-05: qty 2

## 2023-07-05 MED ORDER — ACETAMINOPHEN 325 MG PO TABS
650.0000 mg | ORAL_TABLET | Freq: Four times a day (QID) | ORAL | Status: DC | PRN
  Administered 2023-07-06 – 2023-07-07 (×2): 650 mg via ORAL
  Filled 2023-07-05 (×2): qty 2

## 2023-07-05 MED ORDER — TRAZODONE HCL 50 MG PO TABS
25.0000 mg | ORAL_TABLET | Freq: Every evening | ORAL | Status: DC | PRN
  Filled 2023-07-05: qty 1

## 2023-07-05 MED ORDER — MECLIZINE HCL 25 MG PO TABS
50.0000 mg | ORAL_TABLET | Freq: Three times a day (TID) | ORAL | Status: DC | PRN
  Administered 2023-07-05 – 2023-07-08 (×4): 50 mg via ORAL
  Filled 2023-07-05 (×5): qty 2

## 2023-07-05 MED ORDER — ORAL CARE MOUTH RINSE: 15.0000 mL | OROMUCOSAL | Status: DC | PRN

## 2023-07-05 MED ORDER — IOHEXOL 350 MG/ML SOLN
75.0000 mL | Freq: Once | INTRAVENOUS | Status: AC | PRN
  Administered 2023-07-05: 75 mL via INTRAVENOUS

## 2023-07-05 MED ORDER — CHLORHEXIDINE GLUCONATE CLOTH 2 % EX PADS
6.0000 | MEDICATED_PAD | Freq: Every day | CUTANEOUS | Status: DC
  Administered 2023-07-05: 6 via TOPICAL

## 2023-07-05 MED ORDER — ENOXAPARIN SODIUM 40 MG/0.4ML IJ SOSY
40.0000 mg | PREFILLED_SYRINGE | INTRAMUSCULAR | Status: DC
  Administered 2023-07-05 – 2023-07-07 (×3): 40 mg via SUBCUTANEOUS
  Filled 2023-07-05 (×3): qty 0.4

## 2023-07-05 NOTE — ED Notes (Signed)
ED TO INPATIENT HANDOFF REPORT  Name/Age/Gender Rebecca Ayala 42 y.o. female  Code Status   Home/SNF/Other Home  Chief Complaint Vertigo [R42]  Level of Care/Admitting Diagnosis ED Disposition     ED Disposition  Admit   Condition  --   Comment  Hospital Area: [facility]  HOSPITAL [[id number]]  Level of Care: Stepdown [14]  Admit to SDU based on following criteria: Severe physiological/psychological symptoms:  Any diagnosis requiring assessment & intervention at least every 4 hours on an ongoing basis to obtain desired patient outcomes including stability and rehabilitation  May place patient in observation at [facility] or [facility] if equivalent level of care is available:: Yes  Covid Evaluation: Asymptomatic - no recent exposure (last 10 days) testing not required  Diagnosis: Vertigo [207257]  Admitting Physician: Maryln Gottron [[id number]]  Attending Physician: Kirby Crigler, MIR [email or web address] [[id number]]          Medical History Past Medical History:  Diagnosis Date   Gastritis    Headache(784.0)    Hyperlipidemia     Allergies No Known Allergies  IV Location/Drains/Wounds Patient Lines/Drains/Airways Status     Active Line/Drains/Airways     Name Placement date Placement time Site Days   Peripheral IV 07/05/23 20 G Left Antecubital 07/05/23  1027  Antecubital  less than 1            Labs/Imaging Results for orders placed or performed during the hospital encounter of 07/05/23 (from the past 48 hour(s))  CBG monitoring, ED     Status: Abnormal   Collection Time: 07/05/23 10:11 AM  Result Value Ref Range   Glucose-Capillary 190 (H) 70 - 99 mg/dL    Comment: Glucose reference range applies only to samples taken after fasting for at least 8 hours.  CBC     Status: None   Collection Time: 07/05/23 10:50 AM  Result Value Ref Range   WBC 9.0 4.0 - 10.5 K/uL   RBC 4.64 3.87 - 5.11 MIL/uL   Hemoglobin 14.3 12.0 - 15.0 g/dL   HCT 19.1 47.8 -  29.5 %   MCV 90.9 80.0 - 100.0 fL   MCH 30.8 26.0 - 34.0 pg   MCHC 33.9 30.0 - 36.0 g/dL   RDW 62.1 30.8 - 65.7 %   Platelets 285 150 - 400 K/uL   nRBC 0.0 0.0 - 0.2 %    Comment: Performed at [facility], [address], [place], [place] [address]  Basic metabolic panel     Status: Abnormal   Collection Time: 07/05/23 10:50 AM  Result Value Ref Range   Sodium 137 135 - 145 mmol/L   Potassium 3.1 (L) 3.5 - 5.1 mmol/L   Chloride 106 98 - 111 mmol/L   CO2 19 (L) 22 - 32 mmol/L   Glucose, Bld 197 (H) 70 - 99 mg/dL    Comment: Glucose reference range applies only to samples taken after fasting for at least 8 hours.   BUN 9 6 - 20 mg/dL   Creatinine, Ser 2.95 0.44 - 1.00 mg/dL   Calcium 8.7 (L) 8.9 - 10.3 mg/dL   GFR, Estimated >28 >41 mL/min    Comment: (NOTE) Calculated using the CKD-EPI Creatinine Equation (2021)    Anion gap 12 5 - 15    Comment: Performed at [facility], [address], [place], [place] [address]  I-Stat CG4 Lactic Acid, ED     Status: Abnormal   Collection Time: 07/05/23 11:08 AM  Result Value Ref Range   Lactic Acid, Venous 5.8 (HH) 0.5 - 1.9 mmol/L   Comment NOTIFIED PHYSICIAN   I-stat chem 8, ED (not at [facility], [facility] or [facility])     Status: Abnormal   Collection Time: 07/05/23 11:20 AM  Result Value Ref Range   Sodium 140 135 - 145 mmol/L   Potassium 3.1 (L) 3.5 - 5.1 mmol/L   Chloride 104 98 - 111 mmol/L   BUN 8 6 - 20 mg/dL   Creatinine, Ser 6.96 0.44 - 1.00 mg/dL   Glucose, Bld 295 (H) 70 - 99 mg/dL    Comment: Glucose reference range applies only to samples taken after fasting for at least 8 hours.   Calcium, Ion 1.10 (L) 1.15 - 1.40 mmol/L   TCO2 20 (L) 22 - 32 mmol/L   Hemoglobin 15.0 12.0 - 15.0 g/dL   HCT 28.4 13.2 - 44.0 %  I-Stat CG4 Lactic Acid     Status: Abnormal   Collection Time: 07/05/23 12:54 PM  Result Value Ref Range   Lactic Acid, Venous 4.7 (HH) 0.5 - 1.9 mmol/L   Comment NOTIFIED PHYSICIAN     CT ANGIO HEAD NECK W WO CM  Result Date: 07/05/2023 CLINICAL DATA:  42 year old female with vertigo, dizziness, nausea vomiting. EXAM: CT ANGIOGRAPHY HEAD AND NECK WITH AND WITHOUT CONTRAST TECHNIQUE: Multidetector CT imaging of the head and neck was performed using the standard protocol during bolus administration of intravenous contrast. Multiplanar CT image reconstructions and MIPs were obtained to evaluate the vascular anatomy. Carotid stenosis measurements (when applicable) are obtained utilizing NASCET criteria, using the distal internal carotid diameter as the denominator. RADIATION DOSE REDUCTION: This exam was performed according to the departmental dose-optimization program which includes automated exposure control, adjustment of the mA and/or kV according to patient size and/or use of iterative reconstruction technique. CONTRAST:  75mL OMNIPAQUE IOHEXOL 350 MG/ML SOLN COMPARISON:  None Available. FINDINGS: CT HEAD Brain: Normal cerebral volume. No midline shift, ventriculomegaly, mass effect, evidence of mass lesion, intracranial hemorrhage or evidence of cortically based acute infarction. Gray-white matter differentiation is within normal limits throughout the brain. Partially empty sella. Calvarium and skull base: Negative. Paranasal sinuses: Mild bubbly opacity in the left sphenoid sinus, but tympanic cavities, other Visualized paranasal sinuses and mastoids are stable and well aerated. Orbits: Leftward gaze. Incidental left facial piercing. Visualized scalp soft tissues are within normal limits. CTA NECK Skeleton: Bilateral TMJ degeneration. No acute osseous abnormality identified. Upper chest: Asymmetric right superior breast soft tissue on series 5, image 150. Negative visible superior mediastinum.  Mild upper lung atelectasis. Other neck: Within normal limits. Aortic arch: Normal 3 vessel arch. Right carotid system: Mild motion artifact at the level of the thyroid. Otherwise normal. Left  carotid system: Normal aside from the motion artifact. Vertebral arteries: Normal proximal subclavian arteries and cervical vertebral arteries, left vertebral appears slightly dominant. CTA HEAD Posterior circulation: Normal. No distal vertebral or basilar stenosis. Posterior communicating arteries are diminutive or absent. Anterior circulation: Both ICA siphons appear patent and normal. Patent carotid termini, MCA and ACA origins. Diminutive or absent anterior communicating artery. Bilateral ACA and MCA branches are within normal limits. Venous sinuses: Early contrast timing, grossly patent. Anatomic variants: Mildly dominant left vertebral artery. Review of the MIP images confirms the above findings IMPRESSION: 1. Asymmetric right breast soft tissue visible on series 5, image 150 is nonspecific. Recommend Mammograms. 2. Normal CT appearance of the brain aside from partially empty sella, which is often a  normal anatomic variant but can be associated with idiopathic intracranial hypertension (pseudotumor cerebri). 3. Normal CTA Head and Neck. Electronically Signed   By: Odessa Fleming M.D.   On: 07/05/2023 13:03    Pending Labs Unresulted Labs (From admission, onward)    None       Vitals/Pain Today's Vitals   07/05/23 1110 07/05/23 1130 07/05/23 1220 07/05/23 1430  BP: 131/67 105/68 125/70 119/60  Pulse: 81 81 85 86  Resp: (!) 26 20 11 16   Temp:    97.6 F (36.4 C)  TempSrc:      SpO2: 93% 100% 100% 100%  PainSc:        Isolation Precautions No active isolations  Medications Medications  diazepam (VALIUM) injection 5 mg (5 mg Intravenous Given 07/05/23 1104)  sodium chloride 0.9 % bolus 500 mL (0 mLs Intravenous Stopped 07/05/23 1224)  sodium chloride 0.9 % bolus 500 mL (0 mLs Intravenous Stopped 07/05/23 1224)  iohexol (OMNIPAQUE) 350 MG/ML injection 75 mL (75 mLs Intravenous Contrast Given 07/05/23 1202)  meclizine (ANTIVERT) tablet 25 mg (25 mg Oral Given 07/05/23 1241)  sodium chloride  0.9 % bolus 1,000 mL (0 mLs Intravenous Stopped 07/05/23 1429)    Mobility walks

## 2023-07-05 NOTE — ED Provider Notes (Signed)
Patient presents today for continuous dizziness, nausea and vomiting that started about 30 minutes ago.  Dizziness started last night after consuming a coffee drink.  She does have history of migraines but states that she does not typically have nausea vomiting or dizziness with this.  She denies any headache at this time.  Recommended further evaluation in the emergency room given continuous vomiting and appearance of patient.  Friend who is with her will transport her via POV after leaving urgent care office.   Tomi Bamberger, PA-C 07/05/23 0930

## 2023-07-05 NOTE — ED Notes (Signed)
Patient transported to MRI 

## 2023-07-05 NOTE — ED Notes (Signed)
Patient is being discharged from the Urgent Care and sent to the Emergency Department via POV with her friend. Per Izola Price, [place], patient is in need of higher level of care due to vomiting and needing further eval. Patient is aware and verbalizes understanding of plan of care.  Vitals:   07/05/23 0926  BP: (!) 137/92  Pulse: 66  Resp: 18  Temp: 97.7 F (36.5 C)  SpO2: 100%

## 2023-07-05 NOTE — H&P (Signed)
History and Physical  Desirie Minteer ZOX:[id number] DOB: 30-Jun-1981 DOA: 07/05/2023  PCP: Pcp, No   Chief Complaint: Dizziness, vomiting  HPI: Rebecca Ayala is a 42 y.o. female with no significant past medical history being admitted to the hospital with severe vertigo and vomiting resulting in lactic acidosis.  History is provided by the patient as well as her son who is at the bedside and also translating for her.  Apparently she and a friend had some sort of coffee drink that she has never had before, it caused a pretty immediate severe headache and feeling of dizziness.  Patient does have a prior history of headaches, they are not sure if it is a migraine.  In any case, she was feeling dizzy and lightheaded sort of presyncopal yesterday but went to sleep feeling okay.  This morning she woke up about 730 feeling severely nauseous, like the room was spinning.  She vomited several times and came to the emergency department for evaluation.  Denies any fevers or chills, no focal neurologic deficits, no confusion, slurred speech.  ED Course: Vital signs in the ER are essentially unremarkable.  Lab work was also done significant for potassium 3.1, normal renal function, initial lactate 5.8 has now gradually come down to 3.8 after 3 L of fluid bolus.  In the emergency department, she improved a little bit with Valium and was also noted to have a resentful nystagmus.  Epley maneuver was attempted, and it made her symptoms worse, causing recurrent severe vertigo and vomiting.  Due to her ongoing nausea and vomiting, hospitalist was contacted for admission.  Review of Systems: Please see HPI for pertinent positives and negatives. A complete 10 system review of systems are otherwise negative.  Past Medical History:  Diagnosis Date   Gastritis    Headache(784.0)    Hyperlipidemia    Past Surgical History:  Procedure Laterality Date   DILATION AND CURETTAGE OF UTERUS      Social  History:  reports that she has never smoked. She has never used smokeless tobacco. She reports that she does not drink alcohol and does not use drugs.   No Known Allergies  Family History  Problem Relation Age of Onset   Diabetes Maternal Uncle      Prior to Admission medications   Not on File    Physical Exam: BP 119/60   Pulse 86   Temp 97.6 F (36.4 C)   Resp 16   LMP 07/04/2020   SpO2 100%   General:  Alert, oriented, calm, in no acute distress but looks tired, curled up into a ball.  Answering questions appropriately, son, daughter-in-law and family friend at the bedside. Eyes: EOMI, clear conjuctivae, white sclerea Cardiovascular: RRR, no murmurs or rubs, no peripheral edema  Respiratory: clear to auscultation bilaterally, no wheezes, no crackles  Abdomen: soft, nontender, nondistended, normal bowel tones heard  Skin: dry, no rashes  Musculoskeletal: no joint effusions, normal range of motion  Psychiatric: appropriate affect, normal speech  Neurologic: extraocular muscles intact, clear speech, moving all extremities with intact sensorium         Labs on Admission:  Basic Metabolic Panel: Recent Labs  Lab 07/05/23 1050 07/05/23 1120  NA 137 140  K 3.1* 3.1*  CL 106 104  CO2 19*  --   GLUCOSE 197* 196*  BUN 9 8  CREATININE 0.72 0.60  CALCIUM 8.7*  --    Liver Function Tests: No results for input(s): "AST", "ALT", "ALKPHOS", "BILITOT", "PROT", "  ALBUMIN" in the last 168 hours. No results for input(s): "LIPASE", "AMYLASE" in the last 168 hours. No results for input(s): "AMMONIA" in the last 168 hours. CBC: Recent Labs  Lab 07/05/23 1050 07/05/23 1120  WBC 9.0  --   HGB 14.3 15.0  HCT 42.2 44.0  MCV 90.9  --   PLT 285  --    Cardiac Enzymes: No results for input(s): "CKTOTAL", "CKMB", "CKMBINDEX", "TROPONINI" in the last 168 hours.  BNP (last 3 results) No results for input(s): "BNP" in the last 8760 hours.  ProBNP (last 3 results) No results for  input(s): "PROBNP" in the last 8760 hours.  CBG: Recent Labs  Lab 07/05/23 1011  GLUCAP 190*    Radiological Exams on Admission: CT ANGIO HEAD NECK W WO CM  Result Date: 07/05/2023 CLINICAL DATA:  42 year old female with vertigo, dizziness, nausea vomiting. EXAM: CT ANGIOGRAPHY HEAD AND NECK WITH AND WITHOUT CONTRAST TECHNIQUE: Multidetector CT imaging of the head and neck was performed using the standard protocol during bolus administration of intravenous contrast. Multiplanar CT image reconstructions and MIPs were obtained to evaluate the vascular anatomy. Carotid stenosis measurements (when applicable) are obtained utilizing NASCET criteria, using the distal internal carotid diameter as the denominator. RADIATION DOSE REDUCTION: This exam was performed according to the departmental dose-optimization program which includes automated exposure control, adjustment of the mA and/or kV according to patient size and/or use of iterative reconstruction technique. CONTRAST:  75mL OMNIPAQUE IOHEXOL 350 MG/ML SOLN COMPARISON:  None Available. FINDINGS: CT HEAD Brain: Normal cerebral volume. No midline shift, ventriculomegaly, mass effect, evidence of mass lesion, intracranial hemorrhage or evidence of cortically based acute infarction. Gray-white matter differentiation is within normal limits throughout the brain. Partially empty sella. Calvarium and skull base: Negative. Paranasal sinuses: Mild bubbly opacity in the left sphenoid sinus, but tympanic cavities, other Visualized paranasal sinuses and mastoids are stable and well aerated. Orbits: Leftward gaze. Incidental left facial piercing. Visualized scalp soft tissues are within normal limits. CTA NECK Skeleton: Bilateral TMJ degeneration. No acute osseous abnormality identified. Upper chest: Asymmetric right superior breast soft tissue on series 5, image 150. Negative visible superior mediastinum.  Mild upper lung atelectasis. Other neck: Within normal  limits. Aortic arch: Normal 3 vessel arch. Right carotid system: Mild motion artifact at the level of the thyroid. Otherwise normal. Left carotid system: Normal aside from the motion artifact. Vertebral arteries: Normal proximal subclavian arteries and cervical vertebral arteries, left vertebral appears slightly dominant. CTA HEAD Posterior circulation: Normal. No distal vertebral or basilar stenosis. Posterior communicating arteries are diminutive or absent. Anterior circulation: Both ICA siphons appear patent and normal. Patent carotid termini, MCA and ACA origins. Diminutive or absent anterior communicating artery. Bilateral ACA and MCA branches are within normal limits. Venous sinuses: Early contrast timing, grossly patent. Anatomic variants: Mildly dominant left vertebral artery. Review of the MIP images confirms the above findings IMPRESSION: 1. Asymmetric right breast soft tissue visible on series 5, image 150 is nonspecific. Recommend Mammograms. 2. Normal CT appearance of the brain aside from partially empty sella, which is often a normal anatomic variant but can be associated with idiopathic intracranial hypertension (pseudotumor cerebri). 3. Normal CTA Head and Neck. Electronically Signed   By: Odessa Fleming M.D.   On: 07/05/2023 13:03    Assessment/Plan This is a 42 year old female with a prior history of headaches being admitted to the hospital with severe vertigo with vomiting resulting in lactic acidosis.  Vertigo-likely peripheral, possibly related to BPPV, doubt central  cause.  CT angio head and neck reviewed, without acute finding. -Observation admission -Meclizine as needed for vertigo -MR brain without contrast in the morning to rule out central urology given persistence of symptoms -PT evaluation in the morning  Hypokalemia-likely due to GI losses from vomiting -IV potassium replacement  Lactic acidosis-no signs or symptoms of acute infection, likely this is due to dehydration and strain  from active vomiting. -Continue aggressive fluid resuscitation, lactic acid is improving  Asymmetric breast tissue-seen on CT today, would recommend mammogram  DVT prophylaxis: Lovenox     Code Status: Full Code  Consults called: None  Admission status: Observation  Time spent: 59 minutes  Madalina Rosman Sharlette Dense MD [facility] Pager [phone]  If 7PM-7AM, please contact night-coverage [email or web address] Password [facility]  07/05/2023, 3:23 PM

## 2023-07-05 NOTE — ED Triage Notes (Signed)
Pt here with reports of dizziness and vomiting onset last night. Pt seen at [facility] and sent here for further evaluation. Pt also endorsed some shob. Pt sleepy in triage.

## 2023-07-05 NOTE — ED Provider Notes (Signed)
[place] EMERGENCY DEPARTMENT AT [facility] Provider Note   CSN: [id number] Arrival date & time: 07/05/23  4098     History  Chief Complaint  Patient presents with   Dizziness    Rebecca Ayala is a 42 y.o. female with medical history of gastritis, headaches and hyperlipidemia.  The patient presents to the ED for evaluation of dizziness.  She reports that last night she consumed a [employer] of suppressor drink and then began to experience severe dizziness that has worsened throughout the night.  She reports that this morning when she woke up she began having extreme nausea and vomiting because of her dizziness.  She reports she went to urgent care where they redirected her to the ED for further management and care.  She states that she has been dizzy like this in the past but not as severe as this.  She denies any history of being diagnosed with vertigo.  Denies one-sided weakness or numbness.  States that she feels dizzy as if the room is spinning.  Denies chest pain, shortness of breath, headache, neck pain.   Dizziness Associated symptoms: nausea and vomiting   Associated symptoms: no headaches        Home Medications Prior to Admission medications   Not on File      Allergies    Patient has no known allergies.    Review of Systems   Review of Systems  Constitutional:  Negative for fever.  Gastrointestinal:  Positive for nausea and vomiting.  Musculoskeletal:  Negative for neck pain.  Neurological:  Positive for dizziness. Negative for headaches.    Physical Exam Updated Vital Signs BP 119/60   Pulse 86   Temp 97.6 F (36.4 C)   Resp 16   LMP 07/04/2020   SpO2 100%  Physical Exam Vitals and nursing note reviewed.  Constitutional:      General: She is not in acute distress.    Appearance: Normal appearance. She is not ill-appearing, toxic-appearing or diaphoretic.  HENT:     Head: Normocephalic and atraumatic.     Nose: Nose normal.      Mouth/Throat:     Mouth: Mucous membranes are moist.     Pharynx: Oropharynx is clear.  Eyes:     Extraocular Movements: Extraocular movements intact.     Conjunctiva/sclera: Conjunctivae normal.     Pupils: Pupils are equal, round, and reactive to light.  Cardiovascular:     Rate and Rhythm: Normal rate and regular rhythm.  Pulmonary:     Effort: Pulmonary effort is normal.     Breath sounds: Normal breath sounds. No wheezing.  Abdominal:     General: Abdomen is flat. Bowel sounds are normal.     Palpations: Abdomen is soft.     Tenderness: There is no abdominal tenderness.  Musculoskeletal:     Cervical back: Normal range of motion and neck supple. No tenderness.  Skin:    General: Skin is warm and dry.     Capillary Refill: Capillary refill takes less than 2 seconds.  Neurological:     General: No focal deficit present.     Mental Status: She is alert and oriented to person, place, and time.     GCS: GCS eye subscore is 4. GCS verbal subscore is 5. GCS motor subscore is 6.     Cranial Nerves: Cranial nerves 2-12 are intact. No cranial nerve deficit.     Sensory: Sensation is intact. No sensory deficit.  Motor: Motor function is intact. No weakness.     Comments: No focal deficits on examination.  Patient does have severe horizontal nystagmus.  CN II through intact.  Intact finger-nose, elevation.  Equal grip strength upper and lower extremities bilaterally.     ED Results / Procedures / Treatments   Labs (all labs ordered are listed, but only abnormal results are displayed) Labs Reviewed  BASIC METABOLIC PANEL - Abnormal; Notable for the following components:      Result Value   Potassium 3.1 (*)    CO2 19 (*)    Glucose, Bld 197 (*)    Calcium 8.7 (*)    All other components within normal limits  CBG MONITORING, ED - Abnormal; Notable for the following components:   Glucose-Capillary 190 (*)    All other components within normal limits  I-STAT CHEM 8, ED -  Abnormal; Notable for the following components:   Potassium 3.1 (*)    Glucose, Bld 196 (*)    Calcium, Ion 1.10 (*)    TCO2 20 (*)    All other components within normal limits  I-STAT CG4 LACTIC ACID, ED - Abnormal; Notable for the following components:   Lactic Acid, Venous 5.8 (*)    All other components within normal limits  I-STAT CG4 LACTIC ACID, ED - Abnormal; Notable for the following components:   Lactic Acid, Venous 4.7 (*)    All other components within normal limits  I-STAT CG4 LACTIC ACID, ED - Abnormal; Notable for the following components:   Lactic Acid, Venous 3.8 (*)    All other components within normal limits  CBC  LACTIC ACID, PLASMA  LACTIC ACID, PLASMA  LACTIC ACID, PLASMA    EKG None  Radiology CT ANGIO HEAD NECK W WO CM  Result Date: 07/05/2023 CLINICAL DATA:  42 year old female with vertigo, dizziness, nausea vomiting. EXAM: CT ANGIOGRAPHY HEAD AND NECK WITH AND WITHOUT CONTRAST TECHNIQUE: Multidetector CT imaging of the head and neck was performed using the standard protocol during bolus administration of intravenous contrast. Multiplanar CT image reconstructions and MIPs were obtained to evaluate the vascular anatomy. Carotid stenosis measurements (when applicable) are obtained utilizing NASCET criteria, using the distal internal carotid diameter as the denominator. RADIATION DOSE REDUCTION: This exam was performed according to the departmental dose-optimization program which includes automated exposure control, adjustment of the mA and/or kV according to patient size and/or use of iterative reconstruction technique. CONTRAST:  75mL OMNIPAQUE IOHEXOL 350 MG/ML SOLN COMPARISON:  None Available. FINDINGS: CT HEAD Brain: Normal cerebral volume. No midline shift, ventriculomegaly, mass effect, evidence of mass lesion, intracranial hemorrhage or evidence of cortically based acute infarction. Gray-white matter differentiation is within normal limits throughout the  brain. Partially empty sella. Calvarium and skull base: Negative. Paranasal sinuses: Mild bubbly opacity in the left sphenoid sinus, but tympanic cavities, other Visualized paranasal sinuses and mastoids are stable and well aerated. Orbits: Leftward gaze. Incidental left facial piercing. Visualized scalp soft tissues are within normal limits. CTA NECK Skeleton: Bilateral TMJ degeneration. No acute osseous abnormality identified. Upper chest: Asymmetric right superior breast soft tissue on series 5, image 150. Negative visible superior mediastinum.  Mild upper lung atelectasis. Other neck: Within normal limits. Aortic arch: Normal 3 vessel arch. Right carotid system: Mild motion artifact at the level of the thyroid. Otherwise normal. Left carotid system: Normal aside from the motion artifact. Vertebral arteries: Normal proximal subclavian arteries and cervical vertebral arteries, left vertebral appears slightly dominant. CTA HEAD Posterior circulation: Normal.  No distal vertebral or basilar stenosis. Posterior communicating arteries are diminutive or absent. Anterior circulation: Both ICA siphons appear patent and normal. Patent carotid termini, MCA and ACA origins. Diminutive or absent anterior communicating artery. Bilateral ACA and MCA branches are within normal limits. Venous sinuses: Early contrast timing, grossly patent. Anatomic variants: Mildly dominant left vertebral artery. Review of the MIP images confirms the above findings IMPRESSION: 1. Asymmetric right breast soft tissue visible on series 5, image 150 is nonspecific. Recommend Mammograms. 2. Normal CT appearance of the brain aside from partially empty sella, which is often a normal anatomic variant but can be associated with idiopathic intracranial hypertension (pseudotumor cerebri). 3. Normal CTA Head and Neck. Electronically Signed   By: Odessa Fleming M.D.   On: 07/05/2023 13:03    Procedures Procedures   Medications Ordered in ED Medications  0.9 %   sodium chloride infusion (has no administration in time range)  diazepam (VALIUM) injection 5 mg (5 mg Intravenous Given 07/05/23 1104)  sodium chloride 0.9 % bolus 500 mL (0 mLs Intravenous Stopped 07/05/23 1224)  sodium chloride 0.9 % bolus 500 mL (0 mLs Intravenous Stopped 07/05/23 1224)  iohexol (OMNIPAQUE) 350 MG/ML injection 75 mL (75 mLs Intravenous Contrast Given 07/05/23 1202)  meclizine (ANTIVERT) tablet 25 mg (25 mg Oral Given 07/05/23 1241)  sodium chloride 0.9 % bolus 1,000 mL (0 mLs Intravenous Stopped 07/05/23 1429)    ED Course/ Medical Decision Making/ A&P  Medical Decision Making Amount and/or Complexity of Data Reviewed Labs: ordered. Radiology: ordered.  Risk Prescription drug management.    42 year old female presents to the ED for evaluation.  Please see HPI for further details.  On examination the patient is afebrile and nontachycardic.  Lung sounds are clear bilaterally, not hypoxic.  Abdomen is soft and compressible throughout.  Neurological examination is at baseline with no focal deficits.  Patient does have severe horizontal nystagmus.  She is overall very ill-appearing.  She is actively retching on examination.  Patient CBC without leukocytosis or anemia.  Metabolic panel shows potassium 3.1, creatinine 0.72, anion gap 12.  Lactic acid initially elevated to 5.8, decreased to 3.8 with IV fluids.  CT angio head and neck unremarkable.   Patient treated with Valium 5 mg, states that her dizziness persist after this.  Patient received 3 L of fluid.  Patient also received meclizine.  Attempted Epley maneuver because patient reported that her dizziness persisted but Epley maneuver was unsuccessful, patient began to vomit.  In the setting of elevated lactic acid, continued nausea and vomiting and dizziness we will admit the patient to the hospital.  She may be in need of MRI.  She has no focal deficits.  Discussed case with Dr. Kirby Crigler who has agreed to the  patient.   Final Clinical Impression(s) / ED Diagnoses Final diagnoses:  Dizziness    Rx / DC Orders ED Discharge Orders     None         Al Decant, PA-C 07/05/23 1522    Margarita Grizzle, MD 07/05/23 1524

## 2023-07-05 NOTE — ED Notes (Signed)
Pt remains in MRI

## 2023-07-05 NOTE — ED Triage Notes (Signed)
Patient presents to UC accompanied by her friend for N/V since about 30 mins ago. Dizziness since last night after consuming a coffee drink. Checked her BP and she had a reading of 140/70. Hx of migraines, states she does not present with aura. Past episodes she has not exp n/v or dizziness with her migraines. Not taking any OTC meds for symptom relief.   Denies HA.

## 2023-07-06 ENCOUNTER — Encounter (HOSPITAL_COMMUNITY): Payer: Self-pay | Admitting: Internal Medicine

## 2023-07-06 DIAGNOSIS — R739 Hyperglycemia, unspecified: Secondary | ICD-10-CM | POA: Insufficient documentation

## 2023-07-06 DIAGNOSIS — N6489 Other specified disorders of breast: Secondary | ICD-10-CM

## 2023-07-06 DIAGNOSIS — H5509 Other forms of nystagmus: Secondary | ICD-10-CM

## 2023-07-06 DIAGNOSIS — R112 Nausea with vomiting, unspecified: Secondary | ICD-10-CM

## 2023-07-06 DIAGNOSIS — E872 Acidosis, unspecified: Secondary | ICD-10-CM

## 2023-07-06 HISTORY — DX: Other specified disorders of breast: N64.89

## 2023-07-06 LAB — HIV ANTIBODY (ROUTINE TESTING W REFLEX): HIV Screen 4th Generation wRfx: NONREACTIVE

## 2023-07-06 LAB — MAGNESIUM: Magnesium: 1.9 mg/dL (ref 1.7–2.4)

## 2023-07-06 LAB — BASIC METABOLIC PANEL
Anion gap: 6 (ref 5–15)
BUN: 6 mg/dL (ref 6–20)
CO2: 22 mmol/L (ref 22–32)
Calcium: 8.4 mg/dL — ABNORMAL LOW (ref 8.9–10.3)
Chloride: 111 mmol/L (ref 98–111)
Creatinine, Ser: 0.58 mg/dL (ref 0.44–1.00)
GFR, Estimated: >60 mL/min (ref >60)
Glucose, Bld: 105 mg/dL — ABNORMAL HIGH (ref 70–99)
Potassium: 3.6 mmol/L (ref 3.5–5.1)
Sodium: 139 mmol/L (ref 135–145)

## 2023-07-06 MED ORDER — CHLORHEXIDINE GLUCONATE CLOTH 2 % EX PADS
6.0000 | MEDICATED_PAD | Freq: Every day | CUTANEOUS | Status: DC
  Administered 2023-07-06: 6 via TOPICAL

## 2023-07-06 MED ORDER — DEXTROSE-SODIUM CHLORIDE 5-0.45 % IV SOLN: INTRAVENOUS | Status: AC

## 2023-07-06 NOTE — Hospital Course (Addendum)
42 year old woman PMH migraines, presented with severe vertigo as well as headache that began approximately 12 to 24 hours after drinking some kind of coffee drink at an event.  She was noted to have marked lactic acidosis in the emergency department and was treated with 3 L bolus IV fluids as well as Valium for horizontal nystagmus.  Epley maneuver was attempted the patient could not tolerate as it caused her current severe vertigo and vomiting.  CT head and neck was unremarkable as well as MRI brain.  Admitted for further evaluation and treatment.  Consultants None  Procedures None

## 2023-07-06 NOTE — Progress Notes (Signed)
  Progress Note   Patient: Rebecca Ayala ZHY:[id number] DOB: [birth year] DOA: 07/05/2023     0 DOS: the patient was seen and examined on 07/06/2023   Brief hospital course: 42 year old woman PMH migraines, presented with severe vertigo as well as headache that began approximately 12 to 24 hours after drinking some kind of coffee drink at an event.  She was noted to have marked lactic acidosis in the emergency department and was treated with 3 L bolus IV fluids as well as Valium for horizontal nystagmus.  Epley maneuver was attempted the patient could not tolerate as it caused her current severe vertigo and vomiting.  CT head and neck was unremarkable as well as MRI brain.  Admitted for further evaluation and treatment.  Consultants None  Procedures None  Assessment and Plan: Vertigo Horizontal nystagmus Nausea and vomiting Peripheral, suspect BPPV.  CT head and MRI brain unrevealing.  Stroke ruled out. Partially empty sella, which is often a normal anatomic variant but can be associated with idiopathic intracranial hypertension (pseudotumor cerebri).  No signs or symptoms of intracranial hypertension. PT evaluation for BPPV Can continue meclizine.  Unfortunately medications are not found to have much efficacy for BPPV   Hypokalemia-resolved likely due to GI losses from vomiting Mg2+ WNL   Lactic acidosis no signs or symptoms of acute infection, likely this is due to dehydration and strain from active vomiting. Now resolved  Hyperglycemia  Random hyperglycemia improved today.  Monitor clinically.   Asymmetric breast tissue-seen on CT, would recommend mammogram  Still has severe vertigo, horizontal nystagmus.  Nausea.  Will downgrade to medical bed, involve PT, assess for tolerance to diet.     Subjective:  Still has severe vertigo Some nausea   Physical Exam: Vitals:   07/06/23 0500 07/06/23 0600 07/06/23 0700 07/06/23 0800  BP: (!) 140/57 114/67 111/64 (!)  140/77  Pulse: 65 73 73 89  Resp: 16 16 16 18   Temp:    98.2 F (36.8 C)  TempSrc:    Oral  SpO2: 97% 96% 99% 97%  Weight:      Height:      AFVSS  Physical Exam Vitals reviewed.  Constitutional:      General: She is not in acute distress.    Appearance: She is ill-appearing. She is not toxic-appearing.  Cardiovascular:     Rate and Rhythm: Normal rate and regular rhythm.     Heart sounds: No murmur heard. Pulmonary:     Effort: Pulmonary effort is normal. No respiratory distress.     Breath sounds: No wheezing, rhonchi or rales.  Neurological:     General: No focal deficit present.     Mental Status: She is alert.     Cranial Nerves: No cranial nerve deficit.     Motor: No weakness.     Coordination: Coordination normal.  Psychiatric:        Mood and Affect: Mood normal.        Behavior: Behavior normal.     Data Reviewed: BMP noted Mg2+ WNL  Family Communication: son at bedside  Disposition: Status is: Observation      Time spent: 35 minutes  Author: Brendia Sacks, MD 07/06/2023 10:41 AM  For on call review [email or web address].

## 2023-07-06 NOTE — Evaluation (Signed)
Physical Therapy Evaluation Patient Details Name: Rebecca Ayala MRN: [id number] DOB: 1981-02-05 Today's Date: 07/06/2023  History of Present Illness  42 yo female admitted with vertigo, headache. Hx of migraines  Clinical Impression  On eval, pt required Min A for bed mobility. She sat EOB for ~4-5 minutes with CGA. Deferred further mobility, for today, and assisted pt back to bed to rest. Son reports pt hasn't been able to eat since yesterday. Encouraged fluid intake as she is able to tolerate.   Vestibular Eval: Pt reports dizziness/spinning does not go away. Visualized R beating nystagmus throughout session-nystagmus did not cease. Completed supine head roll testing to L side: (+) for R beating nystagmus (held position for >1 minute). Returned head to center: (+) R beating nystagmus-beating never ceased. Pt remained in supine for >2 minutes. Had pt roll into R sidelying then helped her into sitting position-continued R beating nystagmus that did not cease. Assisted pt back into supine-continued R beating nystagmus. Pt symptomatic (reported dizziness/spinning) throughout session that did not "get better/go away" with rest. She did state intensity felt worse with sitting EOB. No nausea reported by patient, no vomiting.     Will continue to assess pt throughout this hospital stay-plan to f/u on tomorrow. Pt may benefit from continued outpatient rehab with a vestibular specialist.      If plan is discharge home, recommend the following: Assistance with cooking/housework;Assist for transportation;Help with stairs or ramp for entrance;A little help with walking and/or transfers;A lot of help with walking and/or transfers   Can travel by private vehicle        Equipment Recommendations  (continuing to assess)  Recommendations for Other Services       Functional Status Assessment Patient has had a recent decline in their functional status and demonstrates the ability to make  significant improvements in function in a reasonable and predictable amount of time.     Precautions / Restrictions Precautions Precautions: Fall Restrictions Weight Bearing Restrictions: No      Mobility  Bed Mobility Overal bed mobility: Needs Assistance Bed Mobility: Rolling, Sidelying to Sit, Sit to Sidelying Rolling: Contact guard assist Sidelying to sit: Min assist     Sit to sidelying: Contact guard assist General bed mobility comments: Increased time. Cues for logroll-slow. Assist for trunk to upright. Nystagmus still present. Good static sitting balance.    Transfers                   General transfer comment: NT on today    Ambulation/Gait                  Stairs            Wheelchair Mobility     Tilt Bed    Modified Rankin (Stroke Patients Only)       Balance                                             Pertinent Vitals/Pain Pain Assessment Pain Assessment: No/denies pain    Home Living Family/patient expects to be discharged to:: Private residence Living Arrangements: Spouse/significant other Available Help at Discharge: Family Type of Home: House Home Access: Stairs to enter       Home Layout: One level Home Equipment: None      Prior Function Prior Level of Function : Independent/Modified Independent  Extremity/Trunk Assessment   Upper Extremity Assessment Upper Extremity Assessment: Overall WFL for tasks assessed    Lower Extremity Assessment Lower Extremity Assessment: Generalized weakness    Cervical / Trunk Assessment Cervical / Trunk Assessment: Normal  Communication      Cognition Arousal: Alert Behavior During Therapy: WFL for tasks assessed/performed Overall Cognitive Status: Difficult to assess                                          General Comments      Exercises     Assessment/Plan    PT Assessment Patient needs  continued PT services  PT Problem List Decreased mobility;Decreased activity tolerance;Decreased knowledge of use of DME;Pain;Decreased balance       PT Treatment Interventions DME instruction;Gait training;Functional mobility training;Therapeutic activities;Therapeutic exercise;Patient/family education;Balance training    PT Goals (Current goals can be found in the Care Plan section)  Acute Rehab PT Goals Patient Stated Goal: dizziness/spinning to stop PT Goal Formulation: With patient/family Time For Goal Achievement: 07/20/23 Potential to Achieve Goals: Good    Frequency Min 1X/week     Co-evaluation               AM-PAC PT "6 Clicks" Mobility  Outcome Measure Help needed turning from your back to your side while in a flat bed without using bedrails?: A Little Help needed moving from lying on your back to sitting on the side of a flat bed without using bedrails?: A Little Help needed moving to and from a bed to a chair (including a wheelchair)?: A Lot Help needed standing up from a chair using your arms (e.g., wheelchair or bedside chair)?: A Lot Help needed to walk in hospital room?: A Lot Help needed climbing 3-5 steps with a railing? : Total 6 Click Score: 13    End of Session   Activity Tolerance: Patient tolerated treatment well (limited by dizziness/spinning) Patient left: in bed;with call bell/phone within reach;with bed alarm set;with family/visitor present   PT Visit Diagnosis: Dizziness and giddiness (R42);Difficulty in walking, not elsewhere classified (R26.2)    Time: 1610-9604 PT Time Calculation (min) (ACUTE ONLY): 32 min   Charges:   PT Evaluation $PT Eval Low Complexity: 1 Low PT Treatments $Therapeutic Activity: 8-22 mins PT General Charges $$ ACUTE PT VISIT: 1 Visit          Faye Ramsay, PT Acute Rehabilitation  Office: [phone]

## 2023-07-06 NOTE — Plan of Care (Signed)
  Problem: Clinical Measurements: Goal: Will remain free from infection Outcome: Progressing Goal: Diagnostic test results will improve Outcome: Progressing Goal: Cardiovascular complication will be avoided Outcome: Progressing   Problem: Coping: Goal: Level of anxiety will decrease Outcome: Progressing   Problem: Elimination: Goal: Will not experience complications related to urinary retention Outcome: Progressing   Problem: Pain Managment: Goal: General experience of comfort will improve Outcome: Progressing   Problem: Safety: Goal: Ability to remain free from injury will improve Outcome: Progressing   Problem: Skin Integrity: Goal: Risk for impaired skin integrity will decrease Outcome: Progressing

## 2023-07-06 NOTE — Plan of Care (Signed)
Plan of Care reviewed. 

## 2023-07-07 DIAGNOSIS — R739 Hyperglycemia, unspecified: Secondary | ICD-10-CM

## 2023-07-07 NOTE — Plan of Care (Signed)

## 2023-07-07 NOTE — TOC Initial Note (Addendum)
Transition of Care [facility]) - Initial/Assessment Note    Patient Details  Name: Rebecca Ayala MRN: [id number] Date of Birth: June 07, 1981  Transition of Care [facility]) CM/SW Contact:    Harriett Sine, RN Phone Number:[phone]  07/07/2023, 1:33 PM  Clinical Narrative:                 Pt from home with family. Pt scheduled pcp appt with [facility] on November 7th at 10:15 am. Spoke with pt at bedside about SDOH and d/c plans. Pt given pcp appt information and added to AVS packet. TOC following   Expected Discharge Plan: Home/Self Care Barriers to Discharge: No Barriers Identified   Patient Goals and CMS Choice   CMS Medicare.gov Compare Post Acute Care list provided to:: Patient Choice offered to / list presented to : Patient      Expected Discharge Plan and Services In-house Referral: NA   Post Acute Care Choice: Home Health Living arrangements for the past 2 months: Single Family Home                                      Prior Living Arrangements/Services Living arrangements for the past 2 months: Single Family Home Lives with:: Self, Spouse Patient language and need for interpreter reviewed:: Yes Do you feel safe going back to the place where you live?: Yes      Need for Family Participation in Patient Care: Yes (Comment) Care giver support system in place?: Yes (comment) Current home services:  (none) Criminal Activity/Legal Involvement Pertinent to Current Situation/Hospitalization: No - Comment as needed  Activities of Daily Living   ADL Screening (condition at time of admission) Independently performs ADLs?: Yes (appropriate for developmental age) Is the patient deaf or have difficulty hearing?: No Does the patient have difficulty seeing, even when wearing glasses/contacts?: No Does the patient have difficulty concentrating, remembering, or making decisions?: No  Permission Sought/Granted Permission sought to share information with :  Family Supports Permission granted to share information with : Yes, Verbal Permission Granted  Share Information with NAME: Christiana Fuchs (Brother)  [phone] (Mobile)           Emotional Assessment Appearance:: Appears stated age Attitude/Demeanor/Rapport: Engaged Affect (typically observed): Accepting, Calm Orientation: : Oriented to Self, Oriented to Place, Oriented to  Time, Oriented to Situation Alcohol / Substance Use: Never Used Psych Involvement: No (comment)  Admission diagnosis:  Dizziness [R42] Vertigo [R42] Patient Active Problem List   Diagnosis Date Noted   Horizontal nystagmus 07/06/2023   Nausea and vomiting 07/06/2023   Lactic acidosis 07/06/2023   Hyperglycemia 07/06/2023   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 [place] [address] Phone: [phone] Fax: [phone]  [id number] - [place] - [address] [place]) [place] [address] Phone: [phone] Fax: [phone]     Social Determinants of Health (SDOH) Social History: SDOH Screenings   Food Insecurity: No Food Insecurity (07/06/2023)  Housing: Low Risk  (07/06/2023)  Transportation Needs: No Transportation Needs (07/06/2023)  Utilities: Not At Risk (07/06/2023)  Tobacco Use: Low Risk  (07/05/2023)   SDOH Interventions:  Readmission Risk Interventions     No data to display

## 2023-07-07 NOTE — Progress Notes (Signed)
Progress Note   Patient: Rebecca Ayala ZHY:[id number] DOB: September 09, 1981 DOA: 07/05/2023     1 DOS: the patient was seen and examined on 07/07/2023   Brief hospital course: 42 year old woman PMH migraines, presented with severe vertigo as well as headache that began approximately 12 to 24 hours after drinking some kind of coffee drink at an event.  She was noted to have marked lactic acidosis in the emergency department and was treated with 3 L bolus IV fluids as well as Valium for horizontal nystagmus.  Epley maneuver was attempted the patient could not tolerate as it caused her current severe vertigo and vomiting.  CT head and neck was unremarkable as well as MRI brain.  Admitted for further evaluation and treatment.  Has been seen again by therapy but continues to have significant vertigo.  The son did report that the patient has some kind of left ear problem 2 months ago or so for which she saw ENT, no follow-up apparently was needed.  Consultants None  Procedures None  Assessment and Plan: Vertigo Horizontal nystagmus Nausea and vomiting CT head and MRI brain unrevealing.  Stroke ruled out. Partially empty sella, which is often a normal anatomic variant but can be associated with idiopathic intracranial hypertension (pseudotumor cerebri).  No signs or symptoms of intracranial hypertension. Initially BPPV was favored but further testing by physical therapy but seems to make this less likely.  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 Outpatient vestibular follow-up Can continue meclizine.    Hypokalemia-resolved Likely due to GI losses from vomiting   Lactic acidosis No signs or symptoms of acute infection, likely this is due to dehydration and strain from active vomiting. Now resolved   Hyperglycemia  Random hyperglycemia.  Follow-up as an outpatient.   Asymmetric breast tissue-seen on CT, would recommend outpatient mammogram    Continue to monitor, likely home tomorrow if stable.    Subjective:  Feels better but still has vertigo and nausea Able to get to bathroom with assistance Apparently had some kind of ear problem a few months ago for which she saw ENT 1 time with no follow-up.  Physical Exam: Vitals:   07/06/23 1718 07/06/23 2150 07/07/23 0520 07/07/23 1643  BP: 114/77 109/73 (!) 106/56 (!) 110/55  Pulse: 68 68 61 65  Resp:  16 16 18   Temp: 97.8 F (36.6 C) 98.2 F (36.8 C) 98.4 F (36.9 C) 98.2 F (36.8 C)  TempSrc: Oral     SpO2: 98%  100% 100%  Weight:      Height:       Physical Exam Vitals reviewed.  Constitutional:      General: She is not in acute distress.    Appearance: She is ill-appearing. She is not toxic-appearing.  HENT:     Left Ear: Tympanic membrane, ear canal and external ear normal.  Cardiovascular:     Rate and Rhythm: Normal rate and regular rhythm.     Heart sounds: No murmur heard. Pulmonary:     Effort: Pulmonary effort is normal. No respiratory distress.     Breath sounds: No wheezing, rhonchi or rales.  Neurological:     Mental Status: She is alert.  Psychiatric:        Mood and Affect: Mood normal.        Behavior: Behavior normal.     Data Reviewed: No new data  Family Communication: son at bedside  Disposition: Status is: Inpatient Remains inpatient appropriate because: vertigo  Time spent: 20 minutes  Author: Brendia Sacks, MD 07/07/2023 5:25 PM  For on call review [email or web address].

## 2023-07-07 NOTE — Progress Notes (Signed)
Physical Therapy Treatment Patient Details Name: Rebecca Ayala MRN: [id number] DOB: December 10, 1980 Today's Date: 07/07/2023   History of Present Illness 42 yo female admitted with sudden onset vertigo, headache, N/V.  MRI was negative for acute changes. Hx of migraines    PT Comments  Pt continues to report continuous dizziness. She reports no improvement in dizziness but has been able to walk to bathroom with assist and no vomiting today.  In regards to mobility, pt requiring CGA for OOB activity.  She was able to ambulate 53' with RW and CGA, without AD pt very unsteady and requiring mod A.  Recommend supervision and RW for ambulation.   In regards to dizziness/vertigo.  Pt with little improvement.  Symptoms not consistent with central cause (MRI negative, non-direction changing nystagmus, head thrust +, Test of Skew negative).  Symptoms/testing also not consistent with BPPV (constant R beating nystagmus not rotational with dix hallpike, not agotropic or geotropic with roll test,  symptoms constant and worsen with all movement).  Symptoms most consistent with L vestibular hypofunction unknown cause.  Pt denies any recent colds/virus/infection.  She has constant R beating nystagmus, + head thrust test, and slight decrease hearing on L.  Provided HEP of gaze stabilization, VOR cancellation, compensation techniques, and recommendation for outpt PT follow up. Will continue plan of care while hospitalized.    If plan is discharge home, recommend the following: Assistance with cooking/housework;Assist for transportation;Help with stairs or ramp for entrance;A little help with walking and/or transfers;A little help with bathing/dressing/bathroom   Can travel by private vehicle        Equipment Recommendations  Rolling walker (2 wheels)    Recommendations for Other Services       Precautions / Restrictions Precautions Precautions: Fall     Mobility  Bed Mobility Overal bed mobility:  Needs Assistance Bed Mobility: Supine to Sit, Sit to Supine, Rolling Rolling: Supervision   Supine to sit: Supervision Sit to supine: Supervision   General bed mobility comments: Cues for slow transitions with focus points; dizzy but did not require assist    Transfers Overall transfer level: Needs assistance Equipment used: Rolling walker (2 wheels) Transfers: Sit to/from Stand Sit to Stand: Contact guard assist           General transfer comment: CGA for safety    Ambulation/Gait Ambulation/Gait assistance: Contact guard assist, Mod assist Gait Distance (Feet): 80 Feet Assistive device: Rolling walker (2 wheels), None Gait Pattern/deviations: Step-through pattern, Decreased stride length Gait velocity: decreased     General Gait Details: Pt ambulating 47' with RW and CGA.  She had slow cautious pattern but no LOB.  Cues for focus points and segmental turns.  Does reports room spinning constantly.  Attempted 5' without AD to assess pattern but very unsteady needing mod A   Stairs             Wheelchair Mobility     Tilt Bed    Modified Rankin (Stroke Patients Only)       Balance Overall balance assessment: Needs assistance Sitting-balance support: No upper extremity supported Sitting balance-Leahy Scale: Good     Standing balance support: Bilateral upper extremity supported Standing balance-Leahy Scale: Poor Standing balance comment: Unsteady wtihout RW                            Cognition Arousal: Alert Behavior During Therapy: WFL for tasks assessed/performed Overall Cognitive Status: Within Functional Limits for tasks  assessed                                          Exercises      General Comments   Further Vestibular Assessment:  Pt reports symptoms occurred suddenly.  Denies any recent colds, viruses, infections.  Also, denies any falls or jarring activities.  Dizziness is constant and feels like room is  spinning.  It worsens with any movement including straight line walking.  Pt reports no improvement today.  Spontaneous Nystagmus: Constant R beating Gaze Induced: R beating with L and R gaze.  Worse with R gaze. VOR: intact but slow and significantly increased dizziness Test of Skew: horizontal correction but negative for vertical correction/negative for central cause Head Thrust: + corrective saccade bilateral head thrust, worse with L head thrust (larger and slower correction) Smooth Pursuit: Able to track with coordinated eye movement Saccades: Intact with no correction Denies Tinnitus When asked reports slight decrease in hearing on L with finger rub near ear.  [facility]: continual R beating nystagmus, no change, symptoms no worse than with any other movement Horizontal Roll Test: Continual R beating nystagmus with test to R and test to L. Was not geotropic or agotropic -always R beating  Educated pt that results continue to point to a peripheral cause - not central. Educated that symptoms not consistent with BPPV. Son asking about crystals moving.  Educated do not believe BPPV as symptoms not consistent (Hall pike dix, horizontal roll negative, continual R beating, continuous dizziness).  Discussed seems consistent with L vestibular hypofunction but unsure of cause.  Gave pt a habituation exercises (see below), educated on compensation (focus points, segmental turn, RW use), and recommend outpt PT.    HEP: Seated gaze stabilization 2 sets 30 sec 3 x day head turns and head nods.  Seated VOR cancellation 2 sets 30 sec 3 x day head turns.  Practiced for 10 sec - pt with slow movements and increased symptoms.   Notified MD of findings.      Pertinent Vitals/Pain Pain Assessment Pain Assessment: No/denies pain    Home Living                          Prior Function            PT Goals (current goals can now be found in the care plan section) Progress towards PT  goals: Progressing toward goals    Frequency    Min 1X/week      PT Plan      Co-evaluation              AM-PAC PT "6 Clicks" Mobility   Outcome Measure  Help needed turning from your back to your side while in a flat bed without using bedrails?: A Little Help needed moving from lying on your back to sitting on the side of a flat bed without using bedrails?: A Little Help needed moving to and from a bed to a chair (including a wheelchair)?: A Little Help needed standing up from a chair using your arms (e.g., wheelchair or bedside chair)?: A Little Help needed to walk in hospital room?: A Little Help needed climbing 3-5 steps with a railing? : A Little 6 Click Score: 18    End of Session Equipment Utilized During Treatment: Gait belt Activity Tolerance: Patient tolerated  treatment well Patient left: in bed;with call bell/phone within reach;with family/visitor present;with bed alarm set Nurse Communication: Mobility status PT Visit Diagnosis: Dizziness and giddiness (R42);Difficulty in walking, not elsewhere classified (R26.2)     Time: 8295-6213 PT Time Calculation (min) (ACUTE ONLY): 57 min  Charges:    $Gait Training: 8-22 mins $Therapeutic Activity: 23-37 mins $Neuromuscular Re-education: 8-22 mins PT General Charges $$ ACUTE PT VISIT: 1 Visit                     Anise Salvo, PT Acute Rehab [facility] [phone]    Rayetta Humphrey 07/07/2023, 4:42 PM

## 2023-07-08 ENCOUNTER — Other Ambulatory Visit (HOSPITAL_COMMUNITY): Payer: Self-pay

## 2023-07-08 ENCOUNTER — Other Ambulatory Visit: Payer: Self-pay

## 2023-07-08 MED ORDER — HYDROXYZINE HCL 25 MG PO TABS: 25.0000 mg | ORAL_TABLET | Freq: Three times a day (TID) | ORAL | Status: DC | PRN

## 2023-07-08 MED ORDER — ONDANSETRON HCL 4 MG PO TABS
4.0000 mg | ORAL_TABLET | Freq: Four times a day (QID) | ORAL | 0 refills | Status: AC | PRN
  Filled 2023-07-08: qty 20, 5d supply, fill #0

## 2023-07-08 MED ORDER — HYDROXYZINE HCL 25 MG PO TABS
25.0000 mg | ORAL_TABLET | Freq: Three times a day (TID) | ORAL | 0 refills | Status: AC | PRN
  Filled 2023-07-08: qty 20, 7d supply, fill #0

## 2023-07-08 MED ORDER — MECLIZINE HCL 25 MG PO TABS
50.0000 mg | ORAL_TABLET | Freq: Three times a day (TID) | ORAL | 0 refills | Status: AC | PRN
  Filled 2023-07-08: qty 60, 10d supply, fill #0

## 2023-07-08 NOTE — Plan of Care (Signed)

## 2023-07-08 NOTE — TOC Transition Note (Signed)
Transition of Care [facility]) - CM/SW Discharge Note   Patient Details  Name: Rebecca Ayala MRN: [id number] Date of Birth: Oct 19, 1980  [facility]) CM/SW Contact:  Harriett Sine, RN Phone Number:[phone]  07/08/2023, 2:58 PM   Clinical Narrative:    Pt from home,  d/c home with recommendation for outpatient PT. Youth RW  ordered from [facility], delivered to pt room. Pt didn't wait for NCM to schedule Outpatient PT service. Pt arranged transportation  home.    Final next level of care: OP Rehab Barriers to Discharge: No Barriers Identified   Patient Goals and CMS Choice CMS Medicare.gov Compare Post Acute Care list provided to:: Patient Choice offered to / list presented to : Patient  Discharge Placement                    Name of family member notified: pt arranged transport Patient and family notified of of transfer: 07/08/23  Discharge Plan and Services Additional resources added to the After Visit Summary for   In-house Referral: NA   Post Acute Care Choice: Home Health          DME Arranged: Dan Humphreys rolling DME Agency: [employer] Date DME Agency Contacted: 07/08/23 Time DME Agency Contacted: 1200 Representative spoke with at DME Agency: Vaughan Basta HH Arranged:  (pt advised to use OP PT) HH Agency:  (Pt left clinic appt could be schuled)        Social Determinants of Health (SDOH) Interventions SDOH Screenings   Food Insecurity: No Food Insecurity (07/06/2023)  Housing: Low Risk  (07/06/2023)  Transportation Needs: No Transportation Needs (07/06/2023)  Utilities: Not At Risk (07/06/2023)  Tobacco Use: Low Risk  (07/05/2023)     Readmission Risk Interventions     No data to display

## 2023-07-08 NOTE — Discharge Summary (Signed)
Physician Discharge Summary  Rebecca Ayala WGN:[id number] DOB: 1981/02/23 DOA: 07/05/2023  PCP: Pcp, No  Admit date: 07/05/2023 Discharge date: 07/08/2023 Recommendations for Outpatient Follow-up:  Follow up with PCP in 1 weeks-call for appointment Please obtain BMP/CBC in one week  Discharge Dispo: HOME Discharge Condition: Stable Code Status:   Code Status: Full Code Diet recommendation:  Diet Order             Diet regular Room service appropriate? Yes; Fluid consistency: Thin  Diet effective now                    Brief/Interim Summary: 42 yof w/ PMH migraines, presented with severe vertigo as well as headache that began approximately 12 to 24 hours after drinking some kind of coffee drink at an event.She was noted to have marked lactic acidosis in the emergency department and was treated with 3 L bolus IV fluids as well as Valium for horizontal nystagmus.Epley maneuver was attempted the patient could not tolerate as it caused her current severe vertigo and vomiting.  CT head and neck>>unremarkable MRI brain>> unremarkable Patient was admitted for further management of vertigo with parental nystagmus and nausea vomiting. Therapy following and patient continues to have significant vertigo.The son did report that the patient has some kind of left ear problem 2 months ago or so for which she saw ENT, no follow-up apparently was needed. 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 Patient symptomatically feeling much improved today and agreed for discharge home will discharge on antihistaminic and meclizine and outpatient PT vestibular therapy      Discharge Diagnoses:  Principal Problem:   Vertigo Active Problems:   Horizontal nystagmus   Nausea and vomiting   Lactic acidosis   Hyperglycemia   Asymmetrical breasts  Vertigo Horizontal nystagmus Nausea and  vomiting CT head and MRI brain unrevealing.  Stroke ruled out. Partially empty sella, which is often a normal anatomic variant but can be associated with idiopathic intracranial hypertension (pseudotumor cerebri).  No signs or symptoms of intracranial hypertension. Initially BPPV was favored but further testing by physical therapy but seems to make this less likely.  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 Outpatient vestibular follow-up advised continue to follow-up with PCP continue meclizine and antihistaminic as needed   Hypokalemia-resolved Lactic acidosis:Likely from volume depletion and vomiting.   Hyperglycemia :Random hyperglycemia.  She will need follow-up with PCP number provided Asymmetric breast tissue-seen on CT, would recommend outpatient mammogram as per age.  PCP number provided  Consults: none Subjective: Alert awake oriented resting comfortably , feeling much better overall less dizzy  Discharge Exam: Vitals:   07/08/23 0647 07/08/23 1157  BP: 104/68 112/67  Pulse: 61 (!) 55  Resp: 18 18  Temp: 98.4 F (36.9 C) 98.7 F (37.1 C)  SpO2: 99% 100%   General: Pt is alert, awake, not in acute distress Cardiovascular: RRR, S1/S2 +, no rubs, no gallops Respiratory: CTA bilaterally, no wheezing, no rhonchi Abdominal: Soft, NT, ND, bowel sounds + Extremities: no edema, no cyanosis  Discharge Instructions  Discharge Instructions     Discharge instructions   Complete by: As directed    Please call call MD or return to ER for similar or worsening recurring problem that brought you to hospital or if any fever,nausea/vomiting,abdominal pain, uncontrolled pain, chest pain,  shortness of breath or any other alarming symptoms.  Please follow-up your doctor as instructed in a week time and call the office for appointment.  Please avoid alcohol, smoking, or any other illicit substance and maintain healthy habits including taking your  regular medications as prescribed.  You were cared for by a hospitalist during your hospital stay. If you have any questions about your discharge medications or the care you received while you were in the hospital after you are discharged, you can call the unit and ask to speak with the hospitalist on call if the hospitalist that took care of you is not available.  Once you are discharged, your primary care physician will handle any further medical issues. Please note that NO REFILLS for any discharge medications will be authorized once you are discharged, as it is imperative that you return to your primary care physician (or establish a relationship with a primary care physician if you do not have one) for your aftercare needs so that they can reassess your need for medications and monitor your lab values   Increase activity slowly   Complete by: As directed       Allergies as of 07/08/2023   No Known Allergies      Medication List     TAKE these medications    hydrOXYzine 25 MG tablet Commonly known as: ATARAX Take 1 tablet (25 mg total) by mouth 3 (three) times daily as needed for up to 20 doses (dizziness).   meclizine 25 MG tablet Commonly known as: ANTIVERT Take 2 tablets (50 mg total) by mouth 3 (three) times daily as needed for dizziness.   ondansetron 4 MG tablet Commonly known as: ZOFRAN Take 1 tablet (4 mg total) by mouth every 6 (six) hours as needed for nausea.        Follow-up Information     [facility] Follow up in 1 week(s).   Contact information: [address] [place] [address] [phone]               No Known Allergies  The results of significant diagnostics from this hospitalization (including imaging, microbiology, ancillary and laboratory) are listed below for reference.    Microbiology: Recent Results (from the past 240 hour(s))  MRSA Next Gen by PCR, Nasal     Status:  None   Collection Time: 07/05/23  5:42 PM   Specimen: Nasal Mucosa; Nasal Swab  Result Value Ref Range Status   MRSA by PCR Next Gen NOT DETECTED NOT DETECTED Final    Comment: (NOTE) The GeneXpert MRSA Assay (FDA approved for NASAL specimens only), is one component of a comprehensive MRSA colonization surveillance program. It is not intended to diagnose MRSA infection nor to guide or monitor treatment for MRSA infections. Test performance is not FDA approved in patients less than 53 years old. Performed at [facility], [address], [place], [place] [address]     Procedures/Studies: MR BRAIN WO CONTRAST  Result Date: 07/05/2023 CLINICAL DATA:  Syncope EXAM: MRI HEAD WITHOUT CONTRAST TECHNIQUE: Multiplanar, multiecho pulse sequences of the brain and surrounding structures were obtained without intravenous contrast. COMPARISON:  Same day CTA head/neck angiogram FINDINGS: Brain: No acute infarction, hemorrhage, hydrocephalus, extra-axial collection or mass lesion.Sequela of mild chronic microvascular ischemic change. Vascular: Normal flow voids. Skull and upper cervical spine: Normal marrow signal. Sinuses/Orbits: Negative. Other: None. IMPRESSION: No acute intracranial abnormality. Electronically Signed   By: Lorenza Cambridge M.D.   On: 07/05/2023 18:03  CT ANGIO HEAD NECK W WO CM  Result Date: 07/05/2023 CLINICAL DATA:  42 year old female with vertigo, dizziness, nausea vomiting. EXAM: CT ANGIOGRAPHY HEAD AND NECK WITH AND WITHOUT CONTRAST TECHNIQUE: Multidetector CT imaging of the head and neck was performed using the standard protocol during bolus administration of intravenous contrast. Multiplanar CT image reconstructions and MIPs were obtained to evaluate the vascular anatomy. Carotid stenosis measurements (when applicable) are obtained utilizing NASCET criteria, using the distal internal carotid diameter as the denominator. RADIATION DOSE REDUCTION: This exam was  performed according to the departmental dose-optimization program which includes automated exposure control, adjustment of the mA and/or kV according to patient size and/or use of iterative reconstruction technique. CONTRAST:  75mL OMNIPAQUE IOHEXOL 350 MG/ML SOLN COMPARISON:  None Available. FINDINGS: CT HEAD Brain: Normal cerebral volume. No midline shift, ventriculomegaly, mass effect, evidence of mass lesion, intracranial hemorrhage or evidence of cortically based acute infarction. Gray-white matter differentiation is within normal limits throughout the brain. Partially empty sella. Calvarium and skull base: Negative. Paranasal sinuses: Mild bubbly opacity in the left sphenoid sinus, but tympanic cavities, other Visualized paranasal sinuses and mastoids are stable and well aerated. Orbits: Leftward gaze. Incidental left facial piercing. Visualized scalp soft tissues are within normal limits. CTA NECK Skeleton: Bilateral TMJ degeneration. No acute osseous abnormality identified. Upper chest: Asymmetric right superior breast soft tissue on series 5, image 150. Negative visible superior mediastinum.  Mild upper lung atelectasis. Other neck: Within normal limits. Aortic arch: Normal 3 vessel arch. Right carotid system: Mild motion artifact at the level of the thyroid. Otherwise normal. Left carotid system: Normal aside from the motion artifact. Vertebral arteries: Normal proximal subclavian arteries and cervical vertebral arteries, left vertebral appears slightly dominant. CTA HEAD Posterior circulation: Normal. No distal vertebral or basilar stenosis. Posterior communicating arteries are diminutive or absent. Anterior circulation: Both ICA siphons appear patent and normal. Patent carotid termini, MCA and ACA origins. Diminutive or absent anterior communicating artery. Bilateral ACA and MCA branches are within normal limits. Venous sinuses: Early contrast timing, grossly patent. Anatomic variants: Mildly dominant  left vertebral artery. Review of the MIP images confirms the above findings IMPRESSION: 1. Asymmetric right breast soft tissue visible on series 5, image 150 is nonspecific. Recommend Mammograms. 2. Normal CT appearance of the brain aside from partially empty sella, which is often a normal anatomic variant but can be associated with idiopathic intracranial hypertension (pseudotumor cerebri). 3. Normal CTA Head and Neck. Electronically Signed   By: Odessa Fleming M.D.   On: 07/05/2023 13:03    Labs: BNP (last 3 results) No results for input(s): "BNP" in the last 8760 hours. Basic Metabolic Panel: Recent Labs  Lab 07/05/23 1050 07/05/23 1120 07/06/23 0258  NA 137 140 139  K 3.1* 3.1* 3.6  CL 106 104 111  CO2 19*  --  22  GLUCOSE 197* 196* 105*  BUN 9 8 6   CREATININE 0.72 0.60 0.58  CALCIUM 8.7*  --  8.4*  MG  --   --  1.9   Liver Function Tests: No results for input(s): "AST", "ALT", "ALKPHOS", "BILITOT", "PROT", "ALBUMIN" in the last 168 hours. No results for input(s): "LIPASE", "AMYLASE" in the last 168 hours. No results for input(s): "AMMONIA" in the last 168 hours. CBC: Recent Labs  Lab 07/05/23 1050 07/05/23 1120  WBC 9.0  --   HGB 14.3 15.0  HCT 42.2 44.0  MCV 90.9  --   PLT 285  --    Cardiac Enzymes: No  results for input(s): "CKTOTAL", "CKMB", "CKMBINDEX", "TROPONINI" in the last 168 hours. BNP: Invalid input(s): "POCBNP" CBG: Recent Labs  Lab 07/05/23 1011 07/05/23 1819  GLUCAP 190* 124*   D-Dimer No results for input(s): "DDIMER" in the last 72 hours. Hgb A1c No results for input(s): "HGBA1C" in the last 72 hours. Lipid Profile No results for input(s): "CHOL", "HDL", "LDLCALC", "TRIG", "CHOLHDL", "LDLDIRECT" in the last 72 hours. Thyroid function studies No results for input(s): "TSH", "T4TOTAL", "T3FREE", "THYROIDAB" in the last 72 hours.  Invalid input(s): "FREET3" Anemia work up No results for input(s): "VITAMINB12", "FOLATE", "FERRITIN", "TIBC", "IRON",  "RETICCTPCT" in the last 72 hours. Urinalysis    Component Value Date/Time   LABSPEC 1.020 09/13/2012 1410   PHURINE 5.5 09/13/2012 1410   GLUCOSEU NEGATIVE 09/13/2012 1410   HGBUR SMALL (A) 09/13/2012 1410   BILIRUBINUR NEGATIVE 09/13/2012 1410   KETONESUR NEGATIVE 09/13/2012 1410   PROTEINUR NEGATIVE 09/13/2012 1410   UROBILINOGEN 0.2 09/13/2012 1410   NITRITE NEGATIVE 09/13/2012 1410   LEUKOCYTESUR TRACE (A) 09/13/2012 1410   Sepsis Labs Recent Labs  Lab 07/05/23 1050  WBC 9.0   Microbiology Recent Results (from the past 240 hour(s))  MRSA Next Gen by PCR, Nasal     Status: None   Collection Time: 07/05/23  5:42 PM   Specimen: Nasal Mucosa; Nasal Swab  Result Value Ref Range Status   MRSA by PCR Next Gen NOT DETECTED NOT DETECTED Final    Comment: (NOTE) The GeneXpert MRSA Assay (FDA approved for NASAL specimens only), is one component of a comprehensive MRSA colonization surveillance program. It is not intended to diagnose MRSA infection nor to guide or monitor treatment for MRSA infections. Test performance is not FDA approved in patients less than 85 years old. Performed at [facility], [address] [place], [place] [address]      Time coordinating discharge: 25 minutes  SIGNED: Lanae Boast, MD  [facility] 07/08/2023, 1:01 PM  If 7PM-7AM, please contact night-coverage [email or web address]

## 2023-07-08 NOTE — Progress Notes (Signed)
Physical Therapy Treatment Patient Details Name: Rebecca Ayala MRN: [id number] DOB: [birth year] Today's Date: 07/08/2023   History of Present Illness 42 yo female admitted with sudden onset vertigo, headache, N/V.  MRI was negative for acute changes. Hx of migraines    PT Comments  Pt reports minimal improvement in dizziness with movement and more improvement when still.  She does still have spontaneous R beating nystagmus but did note decrease after gaze stabilization exercises.  She demonstrated good understanding of compensation techniques and improved stability/fluidity/speed with ambulation with RW.  She was able to progress to some ambulation without RW but still required min A at times.  Continue to recommend use of RW at home, HEP, and f/u with outpt PT.     If plan is discharge home, recommend the following: Assistance with cooking/housework;Assist for transportation;Help with stairs or ramp for entrance;A little help with walking and/or transfers;A little help with bathing/dressing/bathroom   Can travel by private vehicle        Equipment Recommendations   (Youth Height RW)    Recommendations for Other Services       Precautions / Restrictions Precautions Precautions: Fall     Mobility  Bed Mobility Overal bed mobility: Needs Assistance Bed Mobility: Supine to Sit, Sit to Supine, Rolling Rolling: Supervision Sidelying to sit: Supervision     Sit to sidelying: Supervision General bed mobility comments: Cues for slow transitions with focus points; dizzy but did not require assist    Transfers Overall transfer level: Needs assistance Equipment used: Rolling walker (2 wheels), None Transfers: Sit to/from Stand Sit to Stand: Contact guard assist           General transfer comment: CGA for safety; STS x 3 during session with and without RW; cues for focus points    Ambulation/Gait Ambulation/Gait assistance: Contact guard assist, Supervision, Min  assist Gait Distance (Feet): 200 Feet Assistive device: Rolling walker (2 wheels), None   Gait velocity: decreased     General Gait Details: Cues for segmental turns and focus points as needed.  Ambulating 100' with RW progressing from CGA to supervision - cues for RW proximity, still with decreased speed but improved from yesterday.  Then ambulating 50' x 2 without RW - overall CGA but with 2 loss of balance requiring light min A.  Balance improved with cues to slightly widen stance. Does have toe in on L at baseline.  Worked on multiple turns and change of direction   Comptroller Bed    Modified Rankin (Stroke Patients Only)       Balance Overall balance assessment: Needs assistance Sitting-balance support: No upper extremity supported Sitting balance-Leahy Scale: Good     Standing balance support: Bilateral upper extremity supported, No upper extremity supported Standing balance-Leahy Scale: Fair Standing balance comment: Pt steady with RW.  Worked on progressing without RW (see below for exercises)-some unsteadiness without AD               High Level Balance Comments: Balance activities: 30 sec feet apart EO and EC, 30 sec feet together EO and EC (slightly more sway to L with EC , but no LOB), standing with head turns 30 sec, marching 30 sec EO and EC (2 LOB requiring UE support with EC), turn in circle both directions (decreased speed)            Cognition Arousal: Alert  Behavior During Therapy: WFL for tasks assessed/performed Overall Cognitive Status: Within Functional Limits for tasks assessed                                 General Comments: Utilized AMN interpretor Raquel ID [id number]        Exercises Other Exercises Other Exercises: Vestibular: Seated gaze stabilization with note card head nods and head turns 30 sec x 2.  Pt with improved speed today.  VOR cancellation head nods and turns 30 sec x  2 card and head same direction.  VOR cancellation with head turns 30 sec x 2 with card and head opposite direction.    General Comments General comments (skin integrity, edema, etc.): Utilized interpretor.  Increased time spent educating on vestibular hypofunction (hopeful for gradual improvement over weeks), compensation techniques (focus points, segmental turn, use of RW at home), habituation exercises (Gaze stabilization), and f/u with outpt PT for further treatment/balance.  Pt does report possible transportation issues as she is only one who drives - notified TOC.  Also, noted MD continue to recommend meclizine - encouraged pt to take for symptoms.      Pertinent Vitals/Pain Pain Assessment Pain Assessment: No/denies pain    Home Living                          Prior Function            PT Goals (current goals can now be found in the care plan section) Progress towards PT goals: Progressing toward goals    Frequency    Min 1X/week      PT Plan      Co-evaluation              AM-PAC PT "6 Clicks" Mobility   Outcome Measure  Help needed turning from your back to your side while in a flat bed without using bedrails?: None Help needed moving from lying on your back to sitting on the side of a flat bed without using bedrails?: None Help needed moving to and from a bed to a chair (including a wheelchair)?: A Little Help needed standing up from a chair using your arms (e.g., wheelchair or bedside chair)?: A Little Help needed to walk in hospital room?: A Little Help needed climbing 3-5 steps with a railing? : A Little 6 Click Score: 20    End of Session Equipment Utilized During Treatment: Gait belt Activity Tolerance: Patient tolerated treatment well Patient left: in bed;with call bell/phone within reach;with family/visitor present Nurse Communication: Mobility status PT Visit Diagnosis: Dizziness and giddiness (R42);Difficulty in walking, not elsewhere  classified (R26.2)     Time: 9811-9147 PT Time Calculation (min) (ACUTE ONLY): 54 min  Charges:    $Gait Training: 8-22 mins $Therapeutic Activity: 8-22 mins $Neuromuscular Re-education: 23-37 mins PT General Charges $$ ACUTE PT VISIT: 1 Visit                     Anise Salvo, PT Acute Rehab Services [facility] [phone]    Rebecca Ayala 07/08/2023, 11:22 AM

## 2023-07-09 ENCOUNTER — Other Ambulatory Visit (HOSPITAL_COMMUNITY): Payer: Self-pay

## 2023-07-30 ENCOUNTER — Ambulatory Visit: Payer: Self-pay | Admitting: Student
